# Patient Record
Sex: Male | Born: 1955 | Race: White | Hispanic: No | Smoking: Current every day smoker
Health system: Southern US, Community
[De-identification: ages and names within clinical notes are randomized; demographics above are authoritative.]

## PROBLEM LIST (undated history)

## (undated) DIAGNOSIS — I10 Essential (primary) hypertension: Secondary | ICD-10-CM

## (undated) DIAGNOSIS — F329 Major depressive disorder, single episode, unspecified: Secondary | ICD-10-CM

## (undated) DIAGNOSIS — J449 Chronic obstructive pulmonary disease, unspecified: Secondary | ICD-10-CM

## (undated) DIAGNOSIS — F32A Depression, unspecified: Secondary | ICD-10-CM

## (undated) DIAGNOSIS — K219 Gastro-esophageal reflux disease without esophagitis: Secondary | ICD-10-CM

## (undated) HISTORY — DX: Depression, unspecified: F32.A

## (undated) HISTORY — DX: Gastro-esophageal reflux disease without esophagitis: K21.9

## (undated) HISTORY — PX: TRACHEOSTOMY: SUR1362

## (undated) HISTORY — DX: Essential (primary) hypertension: I10

## (undated) HISTORY — PX: CHEST TUBE INSERTION: SHX231

---
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
## 2019-05-09 ENCOUNTER — Emergency Department: Payer: Medicaid Other

## 2019-05-09 ENCOUNTER — Inpatient Hospital Stay
Admission: EM | Admit: 2019-05-09 | Discharge: 2019-05-11 | DRG: 177 | Disposition: A | Discharge status: Home / Self Care | Payer: Medicaid Other | Attending: Cardiothoracic Surgery | Admitting: Cardiothoracic Surgery

## 2019-05-09 ENCOUNTER — Other Ambulatory Visit: Payer: Self-pay

## 2019-05-09 ENCOUNTER — Encounter: Payer: Self-pay | Admitting: Emergency Medicine

## 2019-05-09 DIAGNOSIS — Z20828 Contact with and (suspected) exposure to other viral communicable diseases: Secondary | ICD-10-CM | POA: Diagnosis present

## 2019-05-09 DIAGNOSIS — J869 Pyothorax without fistula: Principal | ICD-10-CM | POA: Diagnosis present

## 2019-05-09 DIAGNOSIS — E43 Unspecified severe protein-calorie malnutrition: Secondary | ICD-10-CM | POA: Diagnosis present

## 2019-05-09 DIAGNOSIS — Z978 Presence of other specified devices: Secondary | ICD-10-CM | POA: Diagnosis not present

## 2019-05-09 DIAGNOSIS — Z560 Unemployment, unspecified: Secondary | ICD-10-CM

## 2019-05-09 DIAGNOSIS — F418 Other specified anxiety disorders: Secondary | ICD-10-CM | POA: Diagnosis present

## 2019-05-09 DIAGNOSIS — Z681 Body mass index (BMI) 19 or less, adult: Secondary | ICD-10-CM

## 2019-05-09 DIAGNOSIS — Z880 Allergy status to penicillin: Secondary | ICD-10-CM | POA: Diagnosis not present

## 2019-05-09 DIAGNOSIS — F17210 Nicotine dependence, cigarettes, uncomplicated: Secondary | ICD-10-CM | POA: Diagnosis present

## 2019-05-09 DIAGNOSIS — Z79899 Other long term (current) drug therapy: Secondary | ICD-10-CM | POA: Diagnosis not present

## 2019-05-09 DIAGNOSIS — J948 Other specified pleural conditions: Secondary | ICD-10-CM | POA: Diagnosis present

## 2019-05-09 DIAGNOSIS — Z885 Allergy status to narcotic agent status: Secondary | ICD-10-CM | POA: Diagnosis not present

## 2019-05-09 DIAGNOSIS — Z597 Insufficient social insurance and welfare support: Secondary | ICD-10-CM | POA: Diagnosis not present

## 2019-05-09 DIAGNOSIS — Z23 Encounter for immunization: Secondary | ICD-10-CM

## 2019-05-09 DIAGNOSIS — J449 Chronic obstructive pulmonary disease, unspecified: Secondary | ICD-10-CM | POA: Diagnosis present

## 2019-05-09 HISTORY — DX: Chronic obstructive pulmonary disease, unspecified: J44.9

## 2019-05-09 LAB — COMPREHENSIVE METABOLIC PANEL
ALT: 7 U/L (ref 0–44)
AST: 15 U/L (ref 15–41)
Albumin: 2.9 g/dL — ABNORMAL LOW (ref 3.5–5.0)
Alkaline Phosphatase: 111 U/L (ref 38–126)
Anion gap: 12 (ref 5–15)
BUN: 10 mg/dL (ref 8–23)
CO2: 24 mmol/L (ref 22–32)
Calcium: 9.3 mg/dL (ref 8.9–10.3)
Chloride: 100 mmol/L (ref 98–111)
Creatinine, Ser: 0.89 mg/dL (ref 0.61–1.24)
GFR calc Af Amer: >60 mL/min (ref >60)
GFR calc non Af Amer: >60 mL/min (ref >60)
Glucose, Bld: 114 mg/dL — ABNORMAL HIGH (ref 70–99)
Potassium: 4.8 mmol/L (ref 3.5–5.1)
Sodium: 136 mmol/L (ref 135–145)
Total Bilirubin: 0.6 mg/dL (ref 0.3–1.2)
Total Protein: 8.4 g/dL — ABNORMAL HIGH (ref 6.5–8.1)

## 2019-05-09 LAB — PROCALCITONIN: Procalcitonin: 0.11 ng/mL

## 2019-05-09 LAB — URINALYSIS, COMPLETE (UACMP) WITH MICROSCOPIC
Bacteria, UA: NONE SEEN
Bilirubin Urine: NEGATIVE
Glucose, UA: NEGATIVE mg/dL
Hgb urine dipstick: NEGATIVE
Ketones, ur: NEGATIVE mg/dL
Leukocytes,Ua: NEGATIVE
Nitrite: NEGATIVE
Protein, ur: NEGATIVE mg/dL
Specific Gravity, Urine: 1.017 (ref 1.005–1.030)
Squamous Epithelial / HPF: NONE SEEN (ref 0–5)
pH: 5 (ref 5.0–8.0)

## 2019-05-09 LAB — CBC WITH DIFFERENTIAL/PLATELET
Abs Immature Granulocytes: 0.06 10*3/uL (ref 0.00–0.07)
Basophils Absolute: 0.1 10*3/uL (ref 0.0–0.1)
Basophils Relative: 1 %
Eosinophils Absolute: 0.1 10*3/uL (ref 0.0–0.5)
Eosinophils Relative: 1 %
HCT: 31.6 % — ABNORMAL LOW (ref 39.0–52.0)
Hemoglobin: 9.8 g/dL — ABNORMAL LOW (ref 13.0–17.0)
Immature Granulocytes: 1 %
Lymphocytes Relative: 10 %
Lymphs Abs: 1.3 10*3/uL (ref 0.7–4.0)
MCH: 29.3 pg (ref 26.0–34.0)
MCHC: 31 g/dL (ref 30.0–36.0)
MCV: 94.6 fL (ref 80.0–100.0)
Monocytes Absolute: 0.7 10*3/uL (ref 0.1–1.0)
Monocytes Relative: 5 %
Neutro Abs: 10.7 10*3/uL — ABNORMAL HIGH (ref 1.7–7.7)
Neutrophils Relative %: 82 %
Platelets: 110 10*3/uL — ABNORMAL LOW (ref 150–400)
RBC: 3.34 MIL/uL — ABNORMAL LOW (ref 4.22–5.81)
RDW: 13.3 % (ref 11.5–15.5)
WBC: 12.9 10*3/uL — ABNORMAL HIGH (ref 4.0–10.5)
nRBC: 0 % (ref 0.0–0.2)

## 2019-05-09 LAB — SARS CORONAVIRUS 2 BY RT PCR (HOSPITAL ORDER, PERFORMED IN ~~LOC~~ HOSPITAL LAB): SARS Coronavirus 2: NEGATIVE

## 2019-05-09 LAB — LACTIC ACID, PLASMA
Lactic Acid, Venous: 3.6 mmol/L (ref 0.5–1.9)
Lactic Acid, Venous: 3 mmol/L (ref 0.5–1.9)

## 2019-05-09 MED ORDER — VANCOMYCIN HCL IN DEXTROSE 1-5 GM/200ML-% IV SOLN: 1000.0000 mg | Freq: Once | INTRAVENOUS | Status: DC

## 2019-05-09 MED ORDER — BISACODYL 5 MG PO TBEC
10.0000 mg | DELAYED_RELEASE_TABLET | Freq: Every day | ORAL | Status: DC
  Filled 2019-05-09 (×2): qty 2

## 2019-05-09 MED ORDER — TRAMADOL HCL 50 MG PO TABS: 50.0000 mg | ORAL_TABLET | Freq: Four times a day (QID) | ORAL | Status: DC | PRN

## 2019-05-09 MED ORDER — ACETAMINOPHEN 325 MG PO TABS
650.0000 mg | ORAL_TABLET | ORAL | Status: DC | PRN
  Administered 2019-05-09: 20:00:00 650 mg via ORAL
  Filled 2019-05-09: qty 2

## 2019-05-09 MED ORDER — SODIUM CHLORIDE 0.9 % IV SOLN
2.0000 g | Freq: Once | INTRAVENOUS | Status: AC
  Administered 2019-05-09: 2 g via INTRAVENOUS
  Filled 2019-05-09: qty 2

## 2019-05-09 MED ORDER — DEXTROSE-NACL 5-0.45 % IV SOLN
INTRAVENOUS | Status: DC
  Administered 2019-05-10: via INTRAVENOUS

## 2019-05-09 MED ORDER — VANCOMYCIN HCL 1.5 G IV SOLR
1500.0000 mg | Freq: Once | INTRAVENOUS | Status: AC
  Administered 2019-05-09: 20:00:00 1500 mg via INTRAVENOUS
  Filled 2019-05-09: qty 1500

## 2019-05-09 MED ORDER — INFLUENZA VAC SPLIT QUAD 0.5 ML IM SUSY
0.5000 mL | PREFILLED_SYRINGE | INTRAMUSCULAR | Status: AC
  Administered 2019-05-10: 0.5 mL via INTRAMUSCULAR
  Filled 2019-05-09: qty 0.5

## 2019-05-09 MED ORDER — LIDOCAINE HCL (PF) 1 % IJ SOLN
INTRAMUSCULAR | Status: AC
  Filled 2019-05-09: qty 5

## 2019-05-09 MED ORDER — SODIUM CHLORIDE 0.9% FLUSH: 3.0000 mL | Freq: Once | INTRAVENOUS | Status: DC

## 2019-05-09 MED ORDER — ACETAMINOPHEN 325 MG PO TABS: 650.0000 mg | ORAL_TABLET | Freq: Four times a day (QID) | ORAL | Status: DC | PRN

## 2019-05-09 MED ORDER — METRONIDAZOLE IN NACL 5-0.79 MG/ML-% IV SOLN
500.0000 mg | Freq: Once | INTRAVENOUS | Status: AC
  Administered 2019-05-09: 18:00:00 500 mg via INTRAVENOUS
  Filled 2019-05-09: qty 100

## 2019-05-09 MED ORDER — PNEUMOCOCCAL VAC POLYVALENT 25 MCG/0.5ML IJ INJ
0.5000 mL | INJECTION | INTRAMUSCULAR | Status: AC
  Administered 2019-05-10: 13:00:00 0.5 mL via INTRAMUSCULAR
  Filled 2019-05-09: qty 0.5

## 2019-05-09 MED ORDER — ALBUTEROL SULFATE (2.5 MG/3ML) 0.083% IN NEBU
2.5000 mg | INHALATION_SOLUTION | RESPIRATORY_TRACT | Status: DC
  Administered 2019-05-10 (×3): 2.5 mg via RESPIRATORY_TRACT
  Filled 2019-05-09 (×3): qty 3

## 2019-05-09 NOTE — ED Notes (Signed)
Pt watching tv.  No acute resp distress.  Pt eating dinner tray.  Pt waiting on admission.  Family at bedside.  Pt alert.
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
## 2019-05-09 NOTE — ED Notes (Signed)
Report off to daniel rn  

## 2019-05-09 NOTE — ED Notes (Addendum)
Resumed care from [place] rn, pt sitting in a recliner connected to suction.  Dr Faith Rogue and his pa-c arrived in the room.   Family with pt.  Pa-c zac suturing chest tube in left chest in place.  Chest tube was not replaced, only suture and pleurvac replaced.  Pt tolerated well.    Pt alert and sitting in a recliner.

## 2019-05-09 NOTE — ED Provider Notes (Signed)
-----------------------------------------   3:08 PM on 05/09/2019 -----------------------------------------  Blood pressure 115/78, pulse (!) 112, temperature 98 F (36.7 C), temperature source Oral, resp. rate 19, height 5\' 11"  (1.803 m), weight 67.6 kg, SpO2 100 %.  Assuming care from Dr. Joan Mayans.  In short, Tom Mcclure is a 63 y.o. male with a chief complaint of Chest Tube .  Refer to the original H&P for additional details.  The current plan of care is to follow-up CT surgery recommendations for likely infected CT, has received broad spectrum antibiotics.  Patient evaluated by CT surgery, who agree with plan for antibiotics and have secured the chest tube.  They will discussed with hospitalist to assist during admission.    Blake Divine, MD 05/09/19 [phone]
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
## 2019-05-09 NOTE — ED Notes (Signed)
Attempted to call report. Was told nurse was with another patient.

## 2019-05-09 NOTE — Progress Notes (Signed)
PHARMACY -  BRIEF ANTIBIOTIC NOTE   Pharmacy has received consult(s) for Vancomycin and Cefepime from an ED provider.  The patient's profile has been reviewed for ht/wt/allergies/indication/available labs.    One time order(s) placed for Vancomycin 1500mg  IV and Cefepime 2g IV  Further antibiotics/pharmacy consults should be ordered by admitting physician if indicated.                       Thank you, Pearla Dubonnet 05/09/2019  2:45 PM

## 2019-05-09 NOTE — ED Notes (Signed)
Pt alert.  Chest tube connnected to low intermittent suction.  Xray done.  Family with pt.  Pt waiting on admission.
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
## 2019-05-10 ENCOUNTER — Encounter: Payer: Self-pay | Admitting: Radiology

## 2019-05-10 ENCOUNTER — Inpatient Hospital Stay: Payer: Medicaid Other

## 2019-05-10 MED ORDER — ENSURE ENLIVE PO LIQD
237.0000 mL | Freq: Three times a day (TID) | ORAL | Status: DC
  Administered 2019-05-10 – 2019-05-11 (×3): 237 mL via ORAL

## 2019-05-10 MED ORDER — IBUPROFEN 400 MG PO TABS: 400.0000 mg | ORAL_TABLET | Freq: Four times a day (QID) | ORAL | Status: DC | PRN

## 2019-05-10 MED ORDER — METRONIDAZOLE IN NACL 5-0.79 MG/ML-% IV SOLN
500.0000 mg | Freq: Three times a day (TID) | INTRAVENOUS | Status: DC
  Administered 2019-05-10 – 2019-05-11 (×5): 500 mg via INTRAVENOUS
  Filled 2019-05-10 (×7): qty 100

## 2019-05-10 MED ORDER — VANCOMYCIN HCL IN DEXTROSE 750-5 MG/150ML-% IV SOLN
750.0000 mg | Freq: Two times a day (BID) | INTRAVENOUS | Status: DC
  Administered 2019-05-10 (×2): 750 mg via INTRAVENOUS
  Filled 2019-05-10 (×4): qty 150

## 2019-05-10 MED ORDER — SODIUM CHLORIDE 0.9 % IV SOLN
INTRAVENOUS | Status: DC | PRN
  Administered 2019-05-10 – 2019-05-11 (×3): 250 mL via INTRAVENOUS

## 2019-05-10 MED ORDER — ALBUTEROL SULFATE (2.5 MG/3ML) 0.083% IN NEBU: 2.5000 mg | INHALATION_SOLUTION | RESPIRATORY_TRACT | Status: DC | PRN

## 2019-05-10 MED ORDER — MIRTAZAPINE 15 MG PO TABS
7.5000 mg | ORAL_TABLET | Freq: Every day | ORAL | Status: DC
  Administered 2019-05-10: 7.5 mg via ORAL
  Filled 2019-05-10: qty 1

## 2019-05-10 MED ORDER — QUETIAPINE FUMARATE 25 MG PO TABS
50.0000 mg | ORAL_TABLET | Freq: Every day | ORAL | Status: DC
  Administered 2019-05-10: 50 mg via ORAL
  Filled 2019-05-10: qty 2

## 2019-05-10 MED ORDER — ALBUTEROL SULFATE (2.5 MG/3ML) 0.083% IN NEBU
2.5000 mg | INHALATION_SOLUTION | Freq: Four times a day (QID) | RESPIRATORY_TRACT | Status: DC
  Filled 2019-05-10 (×2): qty 3

## 2019-05-10 MED ORDER — IBUPROFEN 400 MG PO TABS
400.0000 mg | ORAL_TABLET | Freq: Four times a day (QID) | ORAL | Status: DC | PRN
  Administered 2019-05-10 (×2): 400 mg via ORAL
  Filled 2019-05-10 (×2): qty 1

## 2019-05-10 MED ORDER — SODIUM CHLORIDE 0.9 % IV SOLN
2.0000 g | Freq: Two times a day (BID) | INTRAVENOUS | Status: DC
  Administered 2019-05-10 – 2019-05-11 (×3): 2 g via INTRAVENOUS
  Filled 2019-05-10 (×4): qty 2

## 2019-05-10 MED ORDER — VITAMIN C 500 MG PO TABS
250.0000 mg | ORAL_TABLET | Freq: Two times a day (BID) | ORAL | Status: DC
  Administered 2019-05-10 – 2019-05-11 (×2): 250 mg via ORAL
  Filled 2019-05-10 (×2): qty 1

## 2019-05-10 MED ORDER — POLYETHYLENE GLYCOL 3350 17 G PO PACK
17.0000 g | PACK | Freq: Every day | ORAL | Status: DC
  Filled 2019-05-10: qty 1

## 2019-05-10 MED ORDER — IOHEXOL 300 MG/ML  SOLN
60.0000 mL | Freq: Once | INTRAMUSCULAR | Status: AC | PRN
  Administered 2019-05-10: 60 mL via INTRAVENOUS

## 2019-05-10 MED ORDER — HYDROMORPHONE HCL 1 MG/ML IJ SOLN: 1.0000 mg | Freq: Four times a day (QID) | INTRAMUSCULAR | Status: DC | PRN

## 2019-05-10 MED ORDER — SERTRALINE HCL 50 MG PO TABS
50.0000 mg | ORAL_TABLET | Freq: Every day | ORAL | Status: DC
  Administered 2019-05-10 – 2019-05-11 (×2): 50 mg via ORAL
  Filled 2019-05-10 (×2): qty 1

## 2019-05-10 MED ORDER — ADULT MULTIVITAMIN W/MINERALS CH
1.0000 | ORAL_TABLET | Freq: Every day | ORAL | Status: DC
  Administered 2019-05-11: 10:00:00 1 via ORAL
  Filled 2019-05-10: qty 1

## 2019-05-10 MED ORDER — FOLIC ACID 1 MG PO TABS
1.0000 mg | ORAL_TABLET | Freq: Every day | ORAL | Status: DC
  Administered 2019-05-10 – 2019-05-11 (×2): 1 mg via ORAL
  Filled 2019-05-10 (×2): qty 1

## 2019-05-10 MED ORDER — MOMETASONE FURO-FORMOTEROL FUM 200-5 MCG/ACT IN AERO
2.0000 | INHALATION_SPRAY | Freq: Two times a day (BID) | RESPIRATORY_TRACT | Status: DC
  Administered 2019-05-10 – 2019-05-11 (×3): 2 via RESPIRATORY_TRACT
  Filled 2019-05-10: qty 8.8

## 2019-05-10 MED ORDER — ENOXAPARIN SODIUM 30 MG/0.3ML ~~LOC~~ SOLN
30.0000 mg | SUBCUTANEOUS | Status: DC
  Filled 2019-05-10: qty 0.3

## 2019-05-10 MED ORDER — HYDROCODONE-ACETAMINOPHEN 5-325 MG PO TABS
1.0000 | ORAL_TABLET | ORAL | Status: DC | PRN
  Administered 2019-05-10 – 2019-05-11 (×2): 1 via ORAL
  Filled 2019-05-10 (×2): qty 1
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
## 2019-05-10 NOTE — Plan of Care (Signed)
  Problem: Activity: Goal: Risk for activity intolerance will decrease Outcome: Progressing   Problem: Nutrition: Goal: Adequate nutrition will be maintained Outcome: Progressing   Problem: Coping: Goal: Level of anxiety will decrease Outcome: Progressing   Problem: Pain Managment: Goal: General experience of comfort will improve Outcome: Progressing   Problem: Skin Integrity: Goal: Risk for impaired skin integrity will decrease Outcome: Progressing   

## 2019-05-10 NOTE — Progress Notes (Signed)
Spoke with Dr. Genevive Bi about pt. Pt complaining of 10 out of 10 pain at chest tube site, he gave orders for 400mg  advil q6hr PRN. chest tube also has significant are leak... Dr. Genevive Bi aware, is to be on -20cm H20. Gave orders to advance diet to regular diet. Dr. Genevive Bi will assess pt tomorrow. Will continue to monitor.  Conley Simmonds, RN, BSN

## 2019-05-10 NOTE — Progress Notes (Signed)
  Patient ID: Tom Mcclure, male   DOB: 1956/01/29, 63 y.o.   MRN: [id number]  HISTORY: Quiet night.  Had pain around chest tube site.  Baseline shortness of breath is unchanged.  Hungry this morning and wants to eat.  No fever overnight.   Vitals:   05/09/19 2353 05/10/19 0455  BP: 107/70 118/71  Pulse: 86 81  Resp: 18 18  Temp: 97.9 F (36.6 C) 98.3 F (36.8 C)  SpO2: 100% 99%     EXAM:    Resp: Lungs are very distant with mechanical sounds from chest tube suction.  No respiratory distress, normal effort. Heart:  Regular without murmurs Abd:  Abdomen is soft, non distended and non tender. No masses are palpable.  There is no rebound and no guarding.  Neurological: Alert and oriented to person, place, and time. Coordination normal.  Skin: Skin is warm and dry. No rash noted. No diaphoretic. No erythema. No pallor.  Psychiatric: Normal mood and affect. Normal behavior. Judgment and thought content normal.   There is a large air leak present.  Chest tube drainage is purulent  ASSESSMENT: Empyema left chest   PLAN:   Will leave chest tube on suction for now. Will obtain chest CT today  Will ask PT to see  I have tried to contact hospitalist this morning for help in managing his medical problems Will try to get all the information from [facility] [employer] regarding his admission there     Nestor Lewandowsky, MDPatient ID: Tom Mcclure, male   DOB: 1955-12-20, 63 y.o.   MRN: [id number]
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
## 2019-05-10 NOTE — Progress Notes (Signed)
Spoke with Mr. Necaise briefly he was watching TV in the bed, calm, alert and oriented X 4. Assessed his chest tube and drainage system. Chest tube is a yellow, rubber tube connected to an [facility] chest drainage system.Drainage system intact. Continuous bubbling in air leak detection chamber likely from healing lung. Purulent drainage in drainage system. Dr. Genevive Bi changed drainage system at 12:30 and requested water seal/off suction. After this patient had intermittent bubbling in water seal detection chamber. Dressing over chest tube clean dry and intact.
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
## 2019-05-10 NOTE — Plan of Care (Signed)
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
## 2019-05-11 ENCOUNTER — Inpatient Hospital Stay: Payer: Medicaid Other

## 2019-05-11 DIAGNOSIS — E43 Unspecified severe protein-calorie malnutrition: Secondary | ICD-10-CM | POA: Insufficient documentation

## 2019-05-11 LAB — LACTIC ACID, PLASMA: Lactic Acid, Venous: 1.5 mmol/L (ref 0.5–1.9)

## 2019-05-11 LAB — CREATININE, SERUM
Creatinine, Ser: 0.66 mg/dL (ref 0.61–1.24)
GFR calc Af Amer: >60 mL/min (ref >60)
GFR calc non Af Amer: >60 mL/min (ref >60)

## 2019-05-11 MED ORDER — VANCOMYCIN HCL 500 MG IV SOLR
500.0000 mg | Freq: Two times a day (BID) | INTRAVENOUS | Status: DC
  Administered 2019-05-11: 500 mg via INTRAVENOUS
  Filled 2019-05-11 (×2): qty 500

## 2019-05-11 MED ORDER — MIRTAZAPINE 15 MG PO TABS: 7.5000 mg | ORAL_TABLET | Freq: Every day | ORAL | 2 refills | Status: AC

## 2019-05-11 MED ORDER — METOPROLOL TARTRATE 25 MG PO TABS: 25.0000 mg | ORAL_TABLET | Freq: Two times a day (BID) | ORAL | 2 refills | Status: DC

## 2019-05-11 MED ORDER — BUDESONIDE-FORMOTEROL FUMARATE 160-4.5 MCG/ACT IN AERO: 2.0000 | INHALATION_SPRAY | Freq: Two times a day (BID) | RESPIRATORY_TRACT | 12 refills | Status: DC

## 2019-05-11 MED ORDER — AMOXICILLIN-POT CLAVULANATE 875-125 MG PO TABS: 1.0000 | ORAL_TABLET | Freq: Two times a day (BID) | ORAL | 0 refills | Status: DC

## 2019-05-11 MED ORDER — ALBUTEROL SULFATE (2.5 MG/3ML) 0.083% IN NEBU: 2.5000 mg | INHALATION_SOLUTION | Freq: Four times a day (QID) | RESPIRATORY_TRACT | Status: DC | PRN

## 2019-05-11 MED ORDER — HYDROCODONE-ACETAMINOPHEN 5-325 MG PO TABS: 1.0000 | ORAL_TABLET | ORAL | 0 refills | Status: DC | PRN

## 2019-05-11 MED ORDER — LEVOFLOXACIN 500 MG PO TABS: 500.0000 mg | ORAL_TABLET | Freq: Every day | ORAL | 0 refills | Status: AC

## 2019-05-11 MED ORDER — QUETIAPINE FUMARATE 50 MG PO TABS: 50.0000 mg | ORAL_TABLET | Freq: Every day | ORAL | 2 refills | Status: AC

## 2019-05-11 MED ORDER — SERTRALINE HCL 50 MG PO TABS: 50.0000 mg | ORAL_TABLET | Freq: Every day | ORAL | 2 refills | Status: AC

## 2019-05-11 NOTE — Plan of Care (Signed)
  Problem: Clinical Measurements: Goal: Respiratory complications will improve Outcome: Progressing   Problem: Activity: Goal: Risk for activity intolerance will decrease Outcome: Progressing   Problem: Nutrition: Goal: Adequate nutrition will be maintained Outcome: Progressing   Problem: Coping: Goal: Level of anxiety will decrease Outcome: Progressing   Problem: Pain Managment: Goal: General experience of comfort will improve Outcome: Progressing   Problem: Safety: Goal: Ability to remain free from injury will improve Outcome: Progressing   

## 2019-05-11 NOTE — Discharge Summary (Signed)
Physician Discharge Summary  Patient ID: Tom Mcclure MRN: [id number] DOB/AGE: 05/03/1956 63 y.o.  Admit date: 05/09/2019 Discharge date: 05/11/2019   Discharge Diagnoses:  Active Problems:   Empyema (HCC)   Protein-calorie malnutrition, severe   Procedures: None  Hospital Course: This patient was admitted to the hospital on 05/09/2019 after being admitted through the emergency room.  He was brought to the emergency room by his daughter who states that he was discharged from a facility in [place] with a chest tube in place and told to establish care here in [place].  At that time it was unclear what exactly he had performed but once records were obtained from [place] over the following few days it was clear that he had a pneumonia with an empyema and is status post thoracoscopic decortication.  When he was discharged from [place] a large bore [place] gastrostomy type tube was inserted.  This was draining to a bag which was completely full.  He was brought into the emergency room by his daughter who is going to be his primary caregiver.  While in the hospital he had a CT scan and multiple chest x-rays all of which demonstrated a loculated empyema cavity which was adequately drained by a large bore chest tube.  He was seen by our social workers our hospitalists physical therapist and nutritionist.  Appropriate follow-ups were made and their input was invaluable during his care.  He was discharged to home with his chest tube draining into a Foley bag.  His chest x-rays were stable.  He will follow-up in my office in 5 days.  And discharge instructions were provided.  Our Education officer, museum met with the family to arrange for medications and supplies.  Disposition: Discharge disposition: 01-Home or Self Care       Discharge Instructions    Activity as tolerated - No restrictions   Complete by: As directed    Diet - low sodium heart healthy   Complete by: As directed    Diet general   Complete  by: As directed    Increase activity slowly   Complete by: As directed      Allergies as of 05/11/2019      Reactions   Morphine And Related Hives   Penicillins Hives      Medication List    TAKE these medications   budesonide-formoterol 160-4.5 MCG/ACT inhaler Commonly known as: Symbicort Inhale 2 puffs into the lungs 2 (two) times daily.   famotidine 20 MG tablet Commonly known as: PEPCID Take 20 mg by mouth daily.   folic acid 1 MG tablet Commonly known as: FOLVITE Take 1 mg by mouth daily.   HYDROcodone-acetaminophen 5-325 MG tablet Commonly known as: NORCO/VICODIN Take 1 tablet by mouth every 4 (four) hours as needed for moderate pain.   ipratropium 17 MCG/ACT inhaler Commonly known as: ATROVENT HFA Inhale 1 puff into the lungs 4 (four) times daily.   levalbuterol 45 MCG/ACT inhaler Commonly known as: XOPENEX HFA Inhale 1-2 puffs into the lungs every 4 (four) hours as needed for wheezing.   levofloxacin 500 MG tablet Commonly known as: Levaquin Take 1 tablet (500 mg total) by mouth daily for 10 days.   metoprolol tartrate 25 MG tablet Commonly known as: LOPRESSOR Take 1 tablet (25 mg total) by mouth 2 (two) times daily. What changed:   medication strength  how much to take   mirtazapine 15 MG tablet Commonly known as: REMERON Take 0.5 tablets (7.5 mg total) by mouth  at bedtime.   polyethylene glycol 17 g packet Commonly known as: MIRALAX / GLYCOLAX Take 17 g by mouth daily.   QUEtiapine 50 MG tablet Commonly known as: SEROQUEL Take 1 tablet (50 mg total) by mouth at bedtime.   senna 8.6 MG tablet Commonly known as: SENOKOT Take 1 tablet by mouth daily.   sertraline 50 MG tablet Commonly known as: ZOLOFT Take 1 tablet (50 mg total) by mouth daily.   thiamine 100 MG tablet Take 100 mg by mouth daily.   vitamin C 500 MG tablet Commonly known as: ASCORBIC ACID Take 500 mg by mouth daily.      Follow-up Information    Adi Seales  Follow up in 1 week(s).           Nestor Lewandowsky, MD

## 2019-05-11 NOTE — Clinical Social Work Note (Signed)
CSW received consult that patient does not have insurance, and needs assistance with medications.  CSW spoke to him and provided good rx coupons for his medications.  Patient was also able to get his inhaler from [facility].  CSW tried to arrange charity home health, spoke to [facility], and they can not accept patient because of his chest tube.  [facility] spoke to [facility] and they are not able to take a chest tube either.  [facility] is not able to accept patient due to his chest tube, patient's daughter was educated on wound care by physician and bedside nurse.  Per patients daughter they have applied for insurance through the affordable care act, and they tried applying for Medicare, but patient was not eligible.  CSW informed her to try to contact DSS to see if he is eligible for Medicaid and to call social security to apply for disability.  Jones Broom. Norval Morton, MSW, LCSW [phone]  05/11/2019 5:57 PM

## 2019-05-11 NOTE — Plan of Care (Signed)
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
## 2019-05-11 NOTE — Progress Notes (Signed)
Patient given discharge instructions with prescriptions with daughter at bedside. Dr. Genevive Bi also at bedside changing chest tube dressing for daughter to watch. MD also went over how to care for the chest tube. Randall Hiss, Education officer, museum spoke with patient and daughter regarding medications. Meds will be ready to be picked up at [facility] after 2pm. Patient and daughter verbalized understanding. Home health set up.
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
## 2019-05-12 ENCOUNTER — Other Ambulatory Visit: Payer: Self-pay

## 2019-05-12 DIAGNOSIS — J869 Pyothorax without fistula: Secondary | ICD-10-CM

## 2019-05-13 ENCOUNTER — Encounter: Payer: Self-pay | Admitting: Cardiothoracic Surgery

## 2019-05-14 LAB — CULTURE, BLOOD (ROUTINE X 2)
Culture: NO GROWTH
Culture: NO GROWTH
Special Requests: ADEQUATE
Special Requests: ADEQUATE

## 2019-05-14 LAB — AEROBIC/ANAEROBIC CULTURE W GRAM STAIN (SURGICAL/DEEP WOUND)

## 2019-05-16 ENCOUNTER — Encounter: Payer: Self-pay | Admitting: Physician Assistant

## 2019-05-16 ENCOUNTER — Ambulatory Visit (INDEPENDENT_AMBULATORY_CARE_PROVIDER_SITE_OTHER): Payer: Self-pay | Admitting: Physician Assistant

## 2019-05-16 ENCOUNTER — Other Ambulatory Visit: Payer: Self-pay

## 2019-05-16 DIAGNOSIS — J869 Pyothorax without fistula: Secondary | ICD-10-CM
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
## 2019-05-16 NOTE — Patient Instructions (Addendum)
Please be sure to have your Chest xray prior to seeing Dr.Oaks 05/19/2019. Continue with the Boost / Ensure daily. Please continue to clean the area and apply dressing daily.

## 2019-05-19 ENCOUNTER — Encounter: Payer: Self-pay | Admitting: Cardiothoracic Surgery

## 2019-05-19 ENCOUNTER — Ambulatory Visit
Admission: RE | Admit: 2019-05-19 | Discharge: 2019-05-19 | Disposition: A | Discharge status: Home / Self Care | Payer: Medicaid Other | Source: Ambulatory Visit | Attending: Cardiothoracic Surgery | Admitting: Cardiothoracic Surgery

## 2019-05-19 ENCOUNTER — Other Ambulatory Visit: Payer: Self-pay

## 2019-05-19 ENCOUNTER — Ambulatory Visit (INDEPENDENT_AMBULATORY_CARE_PROVIDER_SITE_OTHER): Payer: Self-pay | Admitting: Cardiothoracic Surgery

## 2019-05-19 VITALS — BP 112/81 | HR 114 | Temp 98.2°F | Resp 18 | Ht 71.0 in | Wt 101.2 lb

## 2019-05-19 DIAGNOSIS — J869 Pyothorax without fistula: Secondary | ICD-10-CM

## 2019-05-19 NOTE — Progress Notes (Signed)
  Patient ID: Tom Mcclure, male   DOB: Aug 23, 1955, 63 y.o.   MRN: [id number]  HISTORY: He returns today in follow-up.  He is accompanied today by his daughter.  She states that he is able to sit up for about 3 hours throughout the day but then has to go back to bed.  She states that this is because of his shortness of breath.  He also states that he would like something stronger than the current narcotics that he is taking.  He has 20 more days left on his Levaquin.  The drainage from the catheter has been much more clear.  He continues to have an air leak from the system.   Vitals:   05/19/19 0830  BP: 112/81  Pulse: (!) 114  Resp: 18  Temp: 98.2 F (36.8 C)  SpO2: 100%     EXAM:    Resp: Lungs are clear bilaterally but are extremely distant. no respiratory distress, normal effort. Heart:  Regular without murmurs Abd:  Abdomen is soft, non distended and non tender. No masses are palpable.  There is no rebound and no guarding.  Neurological: Alert and oriented to person, place, and time. Coordination normal.  Skin: Skin is warm and dry. No rash noted. No diaphoretic. No erythema. No pallor.  Psychiatric: Normal mood and affect. Normal behavior. Judgment and thought content normal.   Independent review of his chest x-ray shows a continued loculated hydropneumothorax at the base which is unchanged   ASSESSMENT: Empyema cavity   PLAN:   He seems to be getting along pretty well.  I would like him to see our pulmonologist for their input.  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  However right now he seems to be getting along pretty well with the current mushroom catheter in place.  I did secure this again today with a 0 Prolene to prevent it from falling out.  I would also like him to see our pain management service for his chronic pain issues.  I will see him back again in 2 weeks.  I again reviewed with him and his  daughter the procedure of an Eloesser flap.  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  I have also asked him to establish a primary care physician.    Nestor Lewandowsky, MD

## 2019-05-19 NOTE — Patient Instructions (Signed)
We will send the referral to the pain clinic and to pulmonary. Someone from their clinic will call to schedule an appointment.  Please have your chest xray prior to seeing Dr.Oaks.  Please see your follow up appointment listed below.

## 2019-05-25 ENCOUNTER — Ambulatory Visit: Payer: Self-pay | Admitting: Pharmacy Technician

## 2019-05-25 DIAGNOSIS — Z79899 Other long term (current) drug therapy: Secondary | ICD-10-CM

## 2019-06-03 ENCOUNTER — Ambulatory Visit: Payer: Self-pay | Admitting: Cardiothoracic Surgery

## 2019-06-03 ENCOUNTER — Telehealth: Payer: Self-pay

## 2019-06-03 NOTE — Telephone Encounter (Signed)
Spoke with Maudie Mercury regarding referral to Pulmonary. Maudie Mercury stated she would reach out to schedule an appointment for the patient to see Dr.kasa.

## 2019-06-10 ENCOUNTER — Ambulatory Visit
Admission: RE | Admit: 2019-06-10 | Discharge: 2019-06-10 | Disposition: A | Discharge status: Home / Self Care | Payer: Medicaid Other | Source: Home / Self Care | Attending: Cardiothoracic Surgery | Admitting: Cardiothoracic Surgery

## 2019-06-10 ENCOUNTER — Inpatient Hospital Stay
Admission: AD | Admit: 2019-06-10 | Discharge: 2019-06-19 | DRG: 166 | Disposition: A | Discharge status: Home Health | Payer: Medicaid Other | Source: Ambulatory Visit | Attending: Cardiothoracic Surgery | Admitting: Cardiothoracic Surgery

## 2019-06-10 ENCOUNTER — Ambulatory Visit (INDEPENDENT_AMBULATORY_CARE_PROVIDER_SITE_OTHER): Payer: Self-pay | Admitting: Cardiothoracic Surgery

## 2019-06-10 ENCOUNTER — Other Ambulatory Visit: Payer: Self-pay

## 2019-06-10 ENCOUNTER — Ambulatory Visit
Admission: RE | Admit: 2019-06-10 | Discharge: 2019-06-10 | Disposition: A | Discharge status: Home / Self Care | Payer: Medicaid Other | Source: Ambulatory Visit | Attending: Cardiothoracic Surgery | Admitting: Cardiothoracic Surgery

## 2019-06-10 ENCOUNTER — Encounter: Payer: Self-pay | Admitting: Cardiothoracic Surgery

## 2019-06-10 VITALS — BP 141/84 | HR 61 | Temp 97.7°F | Ht 71.0 in | Wt 100.0 lb

## 2019-06-10 DIAGNOSIS — F1010 Alcohol abuse, uncomplicated: Secondary | ICD-10-CM | POA: Diagnosis present

## 2019-06-10 DIAGNOSIS — F17210 Nicotine dependence, cigarettes, uncomplicated: Secondary | ICD-10-CM | POA: Diagnosis present

## 2019-06-10 DIAGNOSIS — J869 Pyothorax without fistula: Secondary | ICD-10-CM

## 2019-06-10 DIAGNOSIS — K219 Gastro-esophageal reflux disease without esophagitis: Secondary | ICD-10-CM | POA: Diagnosis present

## 2019-06-10 DIAGNOSIS — I10 Essential (primary) hypertension: Secondary | ICD-10-CM | POA: Diagnosis present

## 2019-06-10 DIAGNOSIS — R64 Cachexia: Secondary | ICD-10-CM | POA: Diagnosis present

## 2019-06-10 DIAGNOSIS — Z590 Homelessness: Secondary | ICD-10-CM

## 2019-06-10 DIAGNOSIS — S2193XS Puncture wound without foreign body of unspecified part of thorax, sequela: Secondary | ICD-10-CM

## 2019-06-10 DIAGNOSIS — B9562 Methicillin resistant Staphylococcus aureus infection as the cause of diseases classified elsewhere: Secondary | ICD-10-CM | POA: Diagnosis present

## 2019-06-10 DIAGNOSIS — Z8614 Personal history of Methicillin resistant Staphylococcus aureus infection: Secondary | ICD-10-CM

## 2019-06-10 DIAGNOSIS — Z885 Allergy status to narcotic agent status: Secondary | ICD-10-CM

## 2019-06-10 DIAGNOSIS — Z681 Body mass index (BMI) 19 or less, adult: Secondary | ICD-10-CM

## 2019-06-10 DIAGNOSIS — Z79899 Other long term (current) drug therapy: Secondary | ICD-10-CM

## 2019-06-10 DIAGNOSIS — S27391S Other injuries of lung, unilateral, sequela: Secondary | ICD-10-CM

## 2019-06-10 DIAGNOSIS — N400 Enlarged prostate without lower urinary tract symptoms: Secondary | ICD-10-CM | POA: Diagnosis present

## 2019-06-10 DIAGNOSIS — J9621 Acute and chronic respiratory failure with hypoxia: Secondary | ICD-10-CM | POA: Diagnosis not present

## 2019-06-10 DIAGNOSIS — J9811 Atelectasis: Secondary | ICD-10-CM | POA: Diagnosis present

## 2019-06-10 DIAGNOSIS — F329 Major depressive disorder, single episode, unspecified: Secondary | ICD-10-CM | POA: Diagnosis present

## 2019-06-10 DIAGNOSIS — J948 Other specified pleural conditions: Secondary | ICD-10-CM | POA: Diagnosis present

## 2019-06-10 DIAGNOSIS — Z9181 History of falling: Secondary | ICD-10-CM

## 2019-06-10 DIAGNOSIS — Z09 Encounter for follow-up examination after completed treatment for conditions other than malignant neoplasm: Secondary | ICD-10-CM

## 2019-06-10 DIAGNOSIS — Z806 Family history of leukemia: Secondary | ICD-10-CM

## 2019-06-10 DIAGNOSIS — Z1623 Resistance to quinolones and fluoroquinolones: Secondary | ICD-10-CM | POA: Diagnosis present

## 2019-06-10 DIAGNOSIS — Z880 Allergy status to penicillin: Secondary | ICD-10-CM

## 2019-06-10 DIAGNOSIS — Z20828 Contact with and (suspected) exposure to other viral communicable diseases: Secondary | ICD-10-CM | POA: Diagnosis present

## 2019-06-10 DIAGNOSIS — B965 Pseudomonas (aeruginosa) (mallei) (pseudomallei) as the cause of diseases classified elsewhere: Secondary | ICD-10-CM | POA: Diagnosis present

## 2019-06-10 DIAGNOSIS — D638 Anemia in other chronic diseases classified elsewhere: Secondary | ICD-10-CM | POA: Diagnosis present

## 2019-06-10 DIAGNOSIS — E871 Hypo-osmolality and hyponatremia: Secondary | ICD-10-CM | POA: Diagnosis present

## 2019-06-10 DIAGNOSIS — E43 Unspecified severe protein-calorie malnutrition: Secondary | ICD-10-CM | POA: Diagnosis present

## 2019-06-10 DIAGNOSIS — J432 Centrilobular emphysema: Secondary | ICD-10-CM | POA: Diagnosis present

## 2019-06-10 LAB — COMPREHENSIVE METABOLIC PANEL
ALT: 15 U/L (ref 0–44)
AST: 18 U/L (ref 15–41)
Albumin: 3 g/dL — ABNORMAL LOW (ref 3.5–5.0)
Alkaline Phosphatase: 87 U/L (ref 38–126)
Anion gap: 17 — ABNORMAL HIGH (ref 5–15)
BUN: 21 mg/dL (ref 8–23)
CO2: 24 mmol/L (ref 22–32)
Calcium: 10.2 mg/dL (ref 8.9–10.3)
Chloride: 93 mmol/L — ABNORMAL LOW (ref 98–111)
Creatinine, Ser: 0.83 mg/dL (ref 0.61–1.24)
GFR calc Af Amer: >60 mL/min (ref >60)
GFR calc non Af Amer: >60 mL/min (ref >60)
Glucose, Bld: 99 mg/dL (ref 70–99)
Potassium: 4.2 mmol/L (ref 3.5–5.1)
Sodium: 134 mmol/L — ABNORMAL LOW (ref 135–145)
Total Bilirubin: 0.4 mg/dL (ref 0.3–1.2)
Total Protein: 8.6 g/dL — ABNORMAL HIGH (ref 6.5–8.1)

## 2019-06-10 LAB — CBC
HCT: 31.3 % — ABNORMAL LOW (ref 39.0–52.0)
Hemoglobin: 10.1 g/dL — ABNORMAL LOW (ref 13.0–17.0)
MCH: 28.6 pg (ref 26.0–34.0)
MCHC: 32.3 g/dL (ref 30.0–36.0)
MCV: 88.7 fL (ref 80.0–100.0)
Platelets: 294 10*3/uL (ref 150–400)
RBC: 3.53 MIL/uL — ABNORMAL LOW (ref 4.22–5.81)
RDW: 13.5 % (ref 11.5–15.5)
WBC: 16.2 10*3/uL — ABNORMAL HIGH (ref 4.0–10.5)
nRBC: 0 % (ref 0.0–0.2)

## 2019-06-10 LAB — PROTIME-INR
INR: 1.1 (ref 0.8–1.2)
Prothrombin Time: 13.8 seconds (ref 11.4–15.2)

## 2019-06-10 LAB — PROCALCITONIN: Procalcitonin: 0.14 ng/mL

## 2019-06-10 LAB — APTT: aPTT: 29 seconds (ref 24–36)

## 2019-06-10 MED ORDER — QUETIAPINE FUMARATE 25 MG PO TABS
50.0000 mg | ORAL_TABLET | Freq: Every day | ORAL | Status: DC
  Administered 2019-06-10 – 2019-06-13 (×4): 50 mg via ORAL
  Filled 2019-06-10 (×4): qty 2

## 2019-06-10 MED ORDER — ENSURE ENLIVE PO LIQD
237.0000 mL | Freq: Three times a day (TID) | ORAL | Status: DC
  Administered 2019-06-10 – 2019-06-13 (×11): 237 mL via ORAL

## 2019-06-10 MED ORDER — SERTRALINE HCL 50 MG PO TABS
50.0000 mg | ORAL_TABLET | Freq: Every day | ORAL | Status: DC
  Administered 2019-06-10 – 2019-06-14 (×5): 50 mg via ORAL
  Filled 2019-06-10 (×5): qty 1

## 2019-06-10 MED ORDER — METOPROLOL TARTRATE 25 MG PO TABS
12.5000 mg | ORAL_TABLET | Freq: Two times a day (BID) | ORAL | Status: DC
  Administered 2019-06-10 – 2019-06-14 (×9): 12.5 mg via ORAL
  Filled 2019-06-10 (×9): qty 1

## 2019-06-10 MED ORDER — MIRTAZAPINE 15 MG PO TABS
7.5000 mg | ORAL_TABLET | Freq: Every day | ORAL | Status: DC
  Administered 2019-06-10 – 2019-06-13 (×4): 7.5 mg via ORAL
  Filled 2019-06-10 (×4): qty 1

## 2019-06-10 MED ORDER — ADULT MULTIVITAMIN W/MINERALS CH
1.0000 | ORAL_TABLET | Freq: Every day | ORAL | Status: DC
  Administered 2019-06-11 – 2019-06-14 (×4): 1 via ORAL
  Filled 2019-06-10 (×4): qty 1

## 2019-06-10 NOTE — Progress Notes (Signed)
Initial Nutrition Assessment  DOCUMENTATION CODES:   Severe malnutrition in context of chronic illness  INTERVENTION:   Ensure Enlive po TID, each supplement provides 350 kcal and 20 grams of protein  Magic cup TID with meals, each supplement provides 290 kcal and 9 grams of protein  MVI daily   Pt likely moderate refeed risk; recommend monitor K, Mg and P labs   NUTRITION DIAGNOSIS:   Severe Malnutrition related to chronic illness(COPD) as evidenced by severe muscle depletion, severe fat depletion.  GOAL:   Patient will meet greater than or equal to 90% of their needs  MONITOR:   PO intake, Supplement acceptance, Labs, Weight trends, Skin, I & O's  REASON FOR ASSESSMENT:   Consult Poor PO  ASSESSMENT:   63 y/o male with h/o COPD admitted with left-sided empyema.   Met with patient in room today. Pt reports poor appetite and oral intake for several months pta; pt reports that he has only been eating sips and bites of meals at home. Pt reports that he just has no appetite. Pt ate ice cream for lunch today; pt reports that he took one bite of his spaghetti and then did not want it anymore. Pt has been drinking 3 vanilla Ensure per day at home. Pt reports a 65lb(40%) weight loss since July; there is no documented weight history to confirm weight loss. RD suspects patient with chronic malnutrition secondary to COPD. RD will add supplements and MVI to help pt meet his estimated needs.   Medications reviewed and include: remeron  Labs reviewed: Na 134(L) Wbc- 16.2(H), Hgb 10.1(L), Hct 31.3(L)  NUTRITION - FOCUSED PHYSICAL EXAM:    Most Recent Value  Orbital Region  Moderate depletion  Upper Arm Region  Severe depletion  Thoracic and Lumbar Region  Severe depletion  Buccal Region  Moderate depletion  Temple Region  Severe depletion  Clavicle Bone Region  Severe depletion  Clavicle and Acromion Bone Region  Severe depletion  Scapular Bone Region  Severe depletion   Dorsal Hand  Severe depletion  Patellar Region  Severe depletion  Anterior Thigh Region  Severe depletion  Posterior Calf Region  Severe depletion  Edema (RD Assessment)  None  Hair  Reviewed  Eyes  Reviewed  Mouth  Reviewed  Skin  Reviewed  Nails  Reviewed     Diet Order:   Diet Order            Diet regular Room service appropriate? Yes; Fluid consistency: Thin  Diet effective now             EDUCATION NEEDS:   Education needs have been addressed  Skin:  Skin Assessment: Reviewed RN Assessment(incision abdomen)  Last BM:  PTA  Height:   Ht Readings from Last 1 Encounters:  06/10/19 _0  (1.803 m)    Weight:   Wt Readings from Last 1 Encounters:  06/10/19 45.4 kg    Ideal Body Weight:  78.1 kg  BMI:  There is no height or weight on file to calculate BMI.  Estimated Nutritional Needs:   Kcal:  1700-1900kcal/day  Protein:  85-95g/day  Fluid:  >1.4L/day  Koleen Distance MS, RD, LDN Pager #- [phone] Office#- [phone] After Hours Pager: [phone]

## 2019-06-10 NOTE — Patient Instructions (Addendum)
We will admit you to the hospital today to get you worked up. They will call you when a bed is available. Please call us if you do not hear from them by 1pm. You will report to the [place] entrance.
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
## 2019-06-10 NOTE — Progress Notes (Signed)
Patient ID: Tom Mcclure, male   DOB: 1956/06/03, 63 y.o.   MRN: [id number]  Chief Complaint  Patient presents with  . Follow-up    Empyema    HPI Tom Mcclure is a 63 y.o. male.  He presented to our facility approximately 1 month ago.  He was discharged from the [facility] after undergoing a thoracoscopy for a left-sided empyema.  When he was discharged from that facility he was told to follow-up with his primary care doctor but because he is uninsured he just came to the emergency room instead.  I saw the patient at that time.  He had a mushroom catheter placed in his left hemithorax and we had converted that to open drainage.  He comes into the clinic today stating that he is not much better.  He fell and hurt his back and this is limited his mobility.  He continues to smoke approximately 6 cigarettes/day.  He is short of breath at rest.  There is some purulent drainage from the tube and around the tube.  We did get a chest x-ray today and that shows a left hydropneumothorax which is essentially unchanged from before.  Because the patient is uninsured he was unable to meet with the pulmonologist or with the pain management specialist.  We will therefore need to address those issues.  I did review with the daughter and she states that he has a very poor appetite.  He is only able to eat about a half a doughnut per day.  He does drink 3 Ensure or boost per day.  Otherwise he does not eat any solid foods.  He states he is just not hungry.  He has a chronic cough which she has had for 40 years he states.  This is essentially unchanged.   Past Medical History:  Diagnosis Date  . COPD (chronic obstructive pulmonary disease) (HCC)   . Depression   . GERD (gastroesophageal reflux disease)   . Hypertension     Past Surgical History:  Procedure Laterality Date  . CHEST TUBE INSERTION     left chest  . TRACHEOSTOMY      Family History  Problem Relation Age of Onset  .  Leukemia Mother   . Alcohol abuse Father     Social History Social History   Tobacco Use  . Smoking status: Current Every Day Smoker    Packs/day: 0.25    Types: Cigarettes  . Smokeless tobacco: Never Used  Substance Use Topics  . Alcohol use: Yes    Comment: "too much"  . Drug use: Never    Allergies  Allergen Reactions  . Morphine And Related Hives  . Penicillins Hives    Current Outpatient Medications  Medication Sig Dispense Refill  . budesonide-formoterol (SYMBICORT) 160-4.5 MCG/ACT inhaler Inhale 2 puffs into the lungs 2 (two) times daily. 1 Inhaler 12  . famotidine (PEPCID) 20 MG tablet Take 20 mg by mouth daily.    . folic acid (FOLVITE) 1 MG tablet Take 1 mg by mouth daily.    Marland Kitchen HYDROcodone-acetaminophen (NORCO/VICODIN) 5-325 MG tablet Take 1 tablet by mouth every 4 (four) hours as needed for moderate pain. 20 tablet 0  . ipratropium (ATROVENT HFA) 17 MCG/ACT inhaler Inhale 1 puff into the lungs 4 (four) times daily.    Marland Kitchen levalbuterol (XOPENEX HFA) 45 MCG/ACT inhaler Inhale 1-2 puffs into the lungs every 4 (four) hours as needed for wheezing.    . metoprolol tartrate (LOPRESSOR) 25  MG tablet Take 1 tablet (25 mg total) by mouth 2 (two) times daily. 60 tablet 2  . mirtazapine (REMERON) 15 MG tablet Take 0.5 tablets (7.5 mg total) by mouth at bedtime. 30 tablet 2  . polyethylene glycol (MIRALAX / GLYCOLAX) 17 g packet Take 17 g by mouth daily.    . QUEtiapine (SEROQUEL) 50 MG tablet Take 1 tablet (50 mg total) by mouth at bedtime. 30 tablet 2  . senna (SENOKOT) 8.6 MG tablet Take 1 tablet by mouth daily.    . sertraline (ZOLOFT) 50 MG tablet Take 1 tablet (50 mg total) by mouth daily. 30 tablet 2  . thiamine 100 MG tablet Take 100 mg by mouth daily.    . vitamin C (ASCORBIC ACID) 500 MG tablet Take 500 mg by mouth daily.     No current facility-administered medications for this visit.     Location, Quality, Duration, Severity, Timing, Context, Modifying Factors,  Associated Signs and Symptoms.  Review of Systems A complete review of systems was asked and was negative except for the following positive findings shortness of breath, failure to thrive  Blood pressure (!) 141/84, pulse 61, temperature 97.7 F (36.5 C), temperature source Temporal, height 5\' 11"  (1.803 m), weight 100 lb (45.4 kg), SpO2 (!) 88 %.  Physical Exam CONSTITUTIONAL:  Pleasant, well-developed, cachectic, and in no acute distress. EYES: Pupils equal and reactive to light, Sclera non-icteric EARS, NOSE, MOUTH AND THROAT:  The oropharynx was clear.  Dentition is poor repair.  Oral mucosa pink and moist. LYMPH NODES:  Lymph nodes in the neck and axillae were normal RESPIRATORY:  Lungs were very distant.  Normal respiratory effort without pathologic use of accessory muscles of respiration CARDIOVASCULAR: Heart was regular without murmurs.  There were no carotid bruits. GI: The abdomen was soft, nontender, and nondistended. There were no palpable masses. There was no hepatosplenomegaly. There were normal bowel sounds in all quadrants. GU:   MUSCULOSKELETAL:  Normal muscle strength and tone.  No clubbing or cyanosis.   SKIN:  There were no pathologic skin lesions.  There were no nodules on palpation. NEUROLOGIC:  Sensation is normal.  Cranial nerves are grossly intact. PSYCH:  Oriented to person, place and time.  Mood and affect are normal.  I did resecure his drain.  There is pus draining from around the gastrostomy tube.  There is a large air lea  Data Reviewed Chest x-ray  I have personally reviewed the patient's imaging and medical records.    Assessment    Empyema    Plan    We will admit the patient to the hospital.  He needs an extensive evaluation by multiple specialists and he will ultimately require surgical debridement.  I think it would be best if he comes in the hospital today for further management.

## 2019-06-11 LAB — PROCALCITONIN: Procalcitonin: 0.14 ng/mL

## 2019-06-11 LAB — SARS CORONAVIRUS 2 (TAT 6-24 HRS): SARS Coronavirus 2: NEGATIVE

## 2019-06-11 MED ORDER — DIPHENHYDRAMINE HCL 50 MG/ML IJ SOLN: 25.0000 mg | Freq: Three times a day (TID) | INTRAMUSCULAR | Status: DC

## 2019-06-11 MED ORDER — SODIUM CHLORIDE 0.9 % IV SOLN
INTRAVENOUS | Status: DC | PRN
  Administered 2019-06-11: 30 mL via INTRAVENOUS
  Administered 2019-06-12 – 2019-06-13 (×2): 250 mL via INTRAVENOUS

## 2019-06-11 MED ORDER — DIPHENHYDRAMINE HCL 25 MG PO CAPS
25.0000 mg | ORAL_CAPSULE | Freq: Three times a day (TID) | ORAL | Status: DC
  Administered 2019-06-12 – 2019-06-13 (×2): 25 mg via ORAL
  Filled 2019-06-11 (×8): qty 1

## 2019-06-11 MED ORDER — MORPHINE SULFATE (PF) 2 MG/ML IV SOLN
2.0000 mg | INTRAVENOUS | Status: DC | PRN
  Administered 2019-06-11 – 2019-06-12 (×2): 2 mg via INTRAVENOUS
  Filled 2019-06-11 (×3): qty 1

## 2019-06-11 MED ORDER — HYDROCODONE-ACETAMINOPHEN 5-325 MG PO TABS
1.0000 | ORAL_TABLET | ORAL | Status: DC | PRN
  Administered 2019-06-11 – 2019-06-14 (×8): 1 via ORAL
  Filled 2019-06-11 (×8): qty 1

## 2019-06-11 MED ORDER — LEVOFLOXACIN IN D5W 750 MG/150ML IV SOLN
750.0000 mg | INTRAVENOUS | Status: DC
  Administered 2019-06-11 – 2019-06-13 (×3): 750 mg via INTRAVENOUS
  Administered 2019-06-14: 500 mg via INTRAVENOUS
  Filled 2019-06-11 (×4): qty 150

## 2019-06-11 NOTE — Progress Notes (Signed)
[facility] Hospital Day(s): 1.   Post op day(s):  Marland Kitchen   Interval History: Patient seen and examined, no acute events or new complaints overnight. Patient reports continued having pain the tube on the left side of the chest.  Denies shortness of breath.  Denies nausea or vomiting.  The pain does not radiate to other part of the body.  There is no alleviating or aggravating factor.   Vital signs in last 24 hours: [min-max] current  Temp:  [97.9 F (36.6 C)-99.3 F (37.4 C)] 97.9 F (36.6 C) (10/24 0502) Pulse Rate:  [88-109] 89 (10/24 0922) Resp:  [16-24] 20 (10/24 0922) BP: (102-116)/(64-85) 116/66 (10/24 0922) SpO2:  [98 %-100 %] 98 % (10/24 0922) Weight:  [45.4 kg-45.5 kg] 45.5 kg (10/24 0442)     Height: 5\' 11"  (180.3 cm) Weight: 45.5 kg BMI (Calculated): 14   Chest tube: 620 mL in last 24 hours (purulent)  Physical Exam:  Constitutional: alert, cooperative and no distress  Respiratory: breathing non-labored at rest.  Chest tube in place on the left chest.  No sign of wound infection. Cardiovascular: regular rate and sinus rhythm  Gastrointestinal: soft, non-tender, and non-distended  Labs:  CBC Latest Ref Rng & Units 06/10/2019 05/09/2019  WBC 4.0 - 10.5 K/uL 16.2(H) 12.9(H)  Hemoglobin 13.0 - 17.0 g/dL 10.1(L) 9.8(L)  Hematocrit 39.0 - 52.0 % 31.3(L) 31.6(L)  Platelets 150 - 400 K/uL 294 110(L)   CMP Latest Ref Rng & Units 06/10/2019 05/11/2019 05/09/2019  Glucose 70 - 99 mg/dL 99 - 114(H)  BUN 8 - 23 mg/dL 21 - 10  Creatinine 0.61 - 1.24 mg/dL 0.83 0.66 0.89  Sodium 135 - 145 mmol/L 134(L) - 136  Potassium 3.5 - 5.1 mmol/L 4.2 - 4.8  Chloride 98 - 111 mmol/L 93(L) - 100  CO2 22 - 32 mmol/L 24 - 24  Calcium 8.9 - 10.3 mg/dL 10.2 - 9.3  Total Protein 6.5 - 8.1 g/dL 8.6(H) - 8.4(H)  Total Bilirubin 0.3 - 1.2 mg/dL 0.4 - 0.6  Alkaline Phos 38 - 126 U/L 87 - 111  AST 15 - 41 U/L 18 - 15  ALT 0 - 44 U/L 15 - 7    Imaging studies: No new pertinent imaging  studies   Assessment/Plan:  63 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 Patient with chronic emphysema with a complicated previous treatment history.  Today with persistent pain on the chest tube area.  I will optimize his pain medication.  He was taking Norco last admission.  We will restart the Norco to see if this helps with the patient pain.  Patient wishes to be in place that is draining by gravity and working adequately.  Patient needs to be admitted to be completely optimized for surgical management of the chronic empyema.  He has been tried to be done as outpatient but due to social issues patient has not been able to be adequately optimized.  Will follow recommendation by pulmonologist and infectious disease doctor and will appreciate hospitalist evaluation for medical management of his medical conditions.   Arnold Long, MD

## 2019-06-12 LAB — PROCALCITONIN: Procalcitonin: 0.11 ng/mL
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
## 2019-06-12 NOTE — Progress Notes (Signed)
RT note:  Patient refuses to [facility], stating that his breathing is fine.  Instructed on flutter with patient able to complete without assistance.  Patient states he does not need any further respiratory services.

## 2019-06-12 NOTE — Progress Notes (Signed)
[facility] Day(s): 2.   Post op day(s):  Marland Kitchen   Interval History: Patient seen and examined, no acute events or new complaints overnight. Patient reports feeling better today.  He reported the pain is better controlled with the Norco.  He denies shortness of breath.  Denies any significant pain.  There is no pain radiation.  There is no alleviating or aggravating factor.  Denies fever or chills.  Vital signs in last 24 hours: [min-max] current  Temp:  [97.8 F (36.6 C)-99 F (37.2 C)] 98.5 F (36.9 C) (10/25 1956) Pulse Rate:  [80-91] 86 (10/25 1956) Resp:  [17] 17 (10/25 1956) BP: (104-112)/(65-75) 106/72 (10/25 1956) SpO2:  [98 %-100 %] 98 % (10/25 1956) Weight:  [47.5 kg] 47.5 kg (10/25 0500)     Height: 5\' 11"  (180.3 cm) Weight: 47.5 kg BMI (Calculated): 14.61   Physical Exam:  Constitutional: alert, cooperative and no distress  Respiratory: breathing non-labored at rest  Cardiovascular: regular rate and sinus rhythm  Gastrointestinal: soft, non-tender, and non-distended  Labs:  CBC Latest Ref Rng & Units 06/10/2019 05/09/2019  WBC 4.0 - 10.5 K/uL 16.2(H) 12.9(H)  Hemoglobin 13.0 - 17.0 g/dL 10.1(L) 9.8(L)  Hematocrit 39.0 - 52.0 % 31.3(L) 31.6(L)  Platelets 150 - 400 K/uL 294 110(L)   CMP Latest Ref Rng & Units 06/10/2019 05/11/2019 05/09/2019  Glucose 70 - 99 mg/dL 99 - 114(H)  BUN 8 - 23 mg/dL 21 - 10  Creatinine 0.61 - 1.24 mg/dL 0.83 0.66 0.89  Sodium 135 - 145 mmol/L 134(L) - 136  Potassium 3.5 - 5.1 mmol/L 4.2 - 4.8  Chloride 98 - 111 mmol/L 93(L) - 100  CO2 22 - 32 mmol/L 24 - 24  Calcium 8.9 - 10.3 mg/dL 10.2 - 9.3  Total Protein 6.5 - 8.1 g/dL 8.6(H) - 8.4(H)  Total Bilirubin 0.3 - 1.2 mg/dL 0.4 - 0.6  Alkaline Phos 38 - 126 U/L 87 - 111  AST 15 - 41 U/L 18 - 15  ALT 0 - 44 U/L 15 - 7    Imaging studies: No new pertinent imaging studies   Assessment/Plan:  63 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 Patient continued to be stable.  There has been no shortness of breath.  Chest tube continues to drain by gravity.  The pain is better controlled with Norco.  Will continue with current management.  Will continue optimizing patient nutritionally and pulmonary for our definite surgical management of chronic empyema.  Dr. Dorene Ar will continue with the care of this patient.  Patient encouraged to perform incentive spirometer.  We will continue with DVT prophylaxis.  Pending hospitalist and ID evaluation and recommendations.  As per pulmonology, COPD is under control.  Arnold Long, MD

## 2019-06-12 NOTE — Plan of Care (Signed)
Drain sample obtained as the color is milky green. PRN pain medications were provided for pain.  Problem: Education: Goal: Knowledge of General Education information will improve Description: Including pain rating scale, medication(s)/side effects and non-pharmacologic comfort measures Outcome: Not Progressing   Problem: Health Behavior/Discharge Planning: Goal: Ability to manage health-related needs will improve Outcome: Not Progressing   Problem: Clinical Measurements: Goal: Ability to maintain clinical measurements within normal limits will improve Outcome: Not Progressing Goal: Will remain free from infection Outcome: Not Progressing Goal: Diagnostic test results will improve Outcome: Not Progressing Goal: Respiratory complications will improve Outcome: Not Progressing Goal: Cardiovascular complication will be avoided Outcome: Not Progressing   Problem: Clinical Measurements: Goal: Will remain free from infection Outcome: Not Progressing   Problem: Activity: Goal: Risk for activity intolerance will decrease Outcome: Not Progressing   Problem: Nutrition: Goal: Adequate nutrition will be maintained Outcome: Not Progressing   Problem: Coping: Goal: Level of anxiety will decrease Outcome: Not Progressing   Problem: Elimination: Goal: Will not experience complications related to bowel motility Outcome: Not Progressing Goal: Will not experience complications related to urinary retention Outcome: Not Progressing   Problem: Pain Managment: Goal: General experience of comfort will improve Outcome: Not Progressing   Problem: Safety: Goal: Ability to remain free from injury will improve Outcome: Not Progressing   Problem: Skin Integrity: Goal: Risk for impaired skin integrity will decrease Outcome: Not Progressing

## 2019-06-13 ENCOUNTER — Encounter: Payer: Self-pay | Admitting: Anesthesiology

## 2019-06-13 ENCOUNTER — Inpatient Hospital Stay: Payer: Medicaid Other

## 2019-06-13 LAB — COMPREHENSIVE METABOLIC PANEL
ALT: 13 U/L (ref 0–44)
AST: 20 U/L (ref 15–41)
Albumin: 2.5 g/dL — ABNORMAL LOW (ref 3.5–5.0)
Alkaline Phosphatase: 85 U/L (ref 38–126)
Anion gap: 12 (ref 5–15)
BUN: 31 mg/dL — ABNORMAL HIGH (ref 8–23)
CO2: 27 mmol/L (ref 22–32)
Calcium: 9.8 mg/dL (ref 8.9–10.3)
Chloride: 95 mmol/L — ABNORMAL LOW (ref 98–111)
Creatinine, Ser: 0.85 mg/dL (ref 0.61–1.24)
GFR calc Af Amer: >60 mL/min (ref >60)
GFR calc non Af Amer: >60 mL/min (ref >60)
Glucose, Bld: 96 mg/dL (ref 70–99)
Potassium: 4.1 mmol/L (ref 3.5–5.1)
Sodium: 134 mmol/L — ABNORMAL LOW (ref 135–145)
Total Bilirubin: 0.3 mg/dL (ref 0.3–1.2)
Total Protein: 7.5 g/dL (ref 6.5–8.1)

## 2019-06-13 LAB — CBC WITH DIFFERENTIAL/PLATELET
Abs Immature Granulocytes: 0.08 10*3/uL — ABNORMAL HIGH (ref 0.00–0.07)
Basophils Absolute: 0 10*3/uL (ref 0.0–0.1)
Basophils Relative: 0 %
Eosinophils Absolute: 0.2 10*3/uL (ref 0.0–0.5)
Eosinophils Relative: 2 %
HCT: 28.3 % — ABNORMAL LOW (ref 39.0–52.0)
Hemoglobin: 9 g/dL — ABNORMAL LOW (ref 13.0–17.0)
Immature Granulocytes: 1 %
Lymphocytes Relative: 10 %
Lymphs Abs: 1 10*3/uL (ref 0.7–4.0)
MCH: 28.6 pg (ref 26.0–34.0)
MCHC: 31.8 g/dL (ref 30.0–36.0)
MCV: 89.8 fL (ref 80.0–100.0)
Monocytes Absolute: 0.9 10*3/uL (ref 0.1–1.0)
Monocytes Relative: 9 %
Neutro Abs: 7.6 10*3/uL (ref 1.7–7.7)
Neutrophils Relative %: 78 %
Platelets: 471 10*3/uL — ABNORMAL HIGH (ref 150–400)
RBC: 3.15 MIL/uL — ABNORMAL LOW (ref 4.22–5.81)
RDW: 13.6 % (ref 11.5–15.5)
WBC: 9.7 10*3/uL (ref 4.0–10.5)
nRBC: 0 % (ref 0.0–0.2)

## 2019-06-13 LAB — ABO/RH: ABO/RH(D): O POS
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
## 2019-06-13 NOTE — TOC Initial Note (Signed)
Transition of Care [facility]) - Initial/Assessment Note    Patient Details  Name: Tom Mcclure MRN: [id number] Date of Birth: [birth year]  Transition of Care [facility]) CM/SW Contact:    Beverly Sessions, RN Phone Number: 06/13/2019, 4:29 PM  Clinical Narrative:                 [facility] consult for home health, and medication needs.  Per MD plan for OR on Thursday.  RNCM to complete assessment 10/27 and will be following for discharge planning needs         Patient Goals and CMS Choice        Expected Discharge Plan and Services                                                Prior Living Arrangements/Services                       Activities of Daily Living Home Assistive Devices/Equipment: None ADL Screening (condition at time of admission) Patient's cognitive ability adequate to safely complete daily activities?: Yes Is the patient deaf or have difficulty hearing?: No Does the patient have difficulty seeing, even when wearing glasses/contacts?: No Does the patient have difficulty concentrating, remembering, or making decisions?: No Patient able to express need for assistance with ADLs?: Yes Does the patient have difficulty dressing or bathing?: Yes Independently performs ADLs?: No Communication: Independent Dressing (OT): Needs assistance Is this a change from baseline?: Pre-admission baseline Grooming: Needs assistance Is this a change from baseline?: Pre-admission baseline Feeding: Independent Bathing: Needs assistance Is this a change from baseline?: Pre-admission baseline Toileting: Needs assistance Is this a change from baseline?: Pre-admission baseline In/Out Bed: Needs assistance Is this a change from baseline?: Pre-admission baseline Walks in Home: Needs assistance Is this a change from baseline?: Pre-admission baseline Does the patient have difficulty walking or climbing stairs?: Yes Weakness of Legs: Both Weakness of Arms/Hands:  Both  Permission Sought/Granted                  Emotional Assessment              Admission diagnosis:  emypema Patient Active Problem List   Diagnosis Date Noted  . Protein-calorie malnutrition, severe 05/11/2019  . Empyema [facility])    PCP:  Patient, No Pcp Per Pharmacy:   [id number] - [place], [place] - [address] [place] [address] Phone: [phone] Fax: [place] [place] [address] Phone: [phone] Fax: [phone]     Social Determinants of Health (SDOH) Interventions    Readmission Risk Interventions No flowsheet data found.

## 2019-06-13 NOTE — Progress Notes (Signed)
Patient ID: Tom Mcclure, male   DOB: 09-Feb-1956, 63 y.o.   MRN: [id number]  Overall he feels much better since he has been in the hospital.  He is eating well.  He continues to drain purulent material from his chest drain.  His lungs show markedly diminished breath sounds throughout.  His heart is regular.  The dressings on the chest tube site are clean dry.  I reviewed with him in great detail the indications and risk of chest wall resection with open packing of his empyema cavity (Eloesser flap).  I discussed with him the risks of this surgical procedure including risks of chronic ventilator dependence, bleeding, infection, air leak, pneumothorax and death.  I have spoken with the daughter on multiple occasions about the need for commitment to dressing changes.  She understands that this is a task that will require considerable effort on her part and for a prolonged period of time.  I explained to her that this could take many months to heal or may be a chronic wound.  The patient is a poor risk for any other surgical intervention.  He certainly is not a candidate for major thoracotomy.  He understands and would like [place] to proceed.

## 2019-06-14 ENCOUNTER — Inpatient Hospital Stay: Payer: Medicaid Other | Admitting: Anesthesiology

## 2019-06-14 ENCOUNTER — Inpatient Hospital Stay: Payer: Medicaid Other

## 2019-06-14 ENCOUNTER — Inpatient Hospital Stay
Admission: AD | Admit: 2019-06-14 | Discharge: 2019-06-14 | Disposition: A | Discharge status: Home / Self Care | Payer: Medicaid Other | Source: Ambulatory Visit | Attending: Cardiothoracic Surgery | Admitting: Cardiothoracic Surgery

## 2019-06-14 ENCOUNTER — Encounter
Admission: AD | Disposition: A | Discharge status: Home Health | Payer: Self-pay | Source: Ambulatory Visit | Attending: Cardiothoracic Surgery

## 2019-06-14 DIAGNOSIS — J869 Pyothorax without fistula: Secondary | ICD-10-CM

## 2019-06-14 HISTORY — PX: ELOESSOR: SHX5098

## 2019-06-14 LAB — CBC
HCT: 29.5 % — ABNORMAL LOW (ref 39.0–52.0)
Hemoglobin: 9.2 g/dL — ABNORMAL LOW (ref 13.0–17.0)
MCH: 28.8 pg (ref 26.0–34.0)
MCHC: 31.2 g/dL (ref 30.0–36.0)
MCV: 92.2 fL (ref 80.0–100.0)
Platelets: 475 10*3/uL — ABNORMAL HIGH (ref 150–400)
RBC: 3.2 MIL/uL — ABNORMAL LOW (ref 4.22–5.81)
RDW: 13.6 % (ref 11.5–15.5)
WBC: 16.7 10*3/uL — ABNORMAL HIGH (ref 4.0–10.5)
nRBC: 0 % (ref 0.0–0.2)

## 2019-06-14 LAB — BASIC METABOLIC PANEL
Anion gap: 10 (ref 5–15)
BUN: 29 mg/dL — ABNORMAL HIGH (ref 8–23)
CO2: 28 mmol/L (ref 22–32)
Calcium: 9.7 mg/dL (ref 8.9–10.3)
Chloride: 97 mmol/L — ABNORMAL LOW (ref 98–111)
Creatinine, Ser: 1 mg/dL (ref 0.61–1.24)
GFR calc Af Amer: >60 mL/min (ref >60)
GFR calc non Af Amer: >60 mL/min (ref >60)
Glucose, Bld: 137 mg/dL — ABNORMAL HIGH (ref 70–99)
Potassium: 4.4 mmol/L (ref 3.5–5.1)
Sodium: 135 mmol/L (ref 135–145)

## 2019-06-14 LAB — MRSA PCR SCREENING: MRSA by PCR: POSITIVE — AB

## 2019-06-14 SURGERY — CREATION, FLAP, ELOESSER
Anesthesia: General | Laterality: Left

## 2019-06-14 MED ORDER — BUPIVACAINE HCL (PF) 0.25 % IJ SOLN
INTRAMUSCULAR | Status: AC
  Filled 2019-06-14: qty 30

## 2019-06-14 MED ORDER — KCL IN DEXTROSE-NACL 20-5-0.45 MEQ/L-%-% IV SOLN
INTRAVENOUS | Status: DC
  Administered 2019-06-14: 22:00:00 via INTRAVENOUS
  Filled 2019-06-14 (×4): qty 1000

## 2019-06-14 MED ORDER — IPRATROPIUM BROMIDE HFA 17 MCG/ACT IN AERS
1.0000 | INHALATION_SPRAY | Freq: Two times a day (BID) | RESPIRATORY_TRACT | Status: DC
  Filled 2019-06-14: qty 12.9

## 2019-06-14 MED ORDER — HYDROMORPHONE HCL 1 MG/ML IJ SOLN
INTRAMUSCULAR | Status: AC
  Filled 2019-06-14: qty 1

## 2019-06-14 MED ORDER — MIRTAZAPINE 15 MG PO TABS
7.5000 mg | ORAL_TABLET | Freq: Every day | ORAL | Status: DC
  Administered 2019-06-14 – 2019-06-18 (×5): 7.5 mg via ORAL
  Filled 2019-06-14 (×5): qty 1

## 2019-06-14 MED ORDER — PROPOFOL 10 MG/ML IV BOLUS
INTRAVENOUS | Status: DC | PRN
  Administered 2019-06-14: 80 mg via INTRAVENOUS

## 2019-06-14 MED ORDER — HYDROMORPHONE HCL 1 MG/ML IJ SOLN
INTRAMUSCULAR | Status: AC
  Administered 2019-06-14: 0.25 mg via INTRAVENOUS
  Filled 2019-06-14: qty 1

## 2019-06-14 MED ORDER — LACTATED RINGERS IV SOLN
INTRAVENOUS | Status: DC
  Administered 2019-06-14: 12:00:00 via INTRAVENOUS

## 2019-06-14 MED ORDER — DEXMEDETOMIDINE HCL 200 MCG/2ML IV SOLN
INTRAVENOUS | Status: DC | PRN
  Administered 2019-06-14: 8 ug via INTRAVENOUS

## 2019-06-14 MED ORDER — ACETAMINOPHEN 10 MG/ML IV SOLN
INTRAVENOUS | Status: DC | PRN
  Administered 2019-06-14: 1000 mg via INTRAVENOUS

## 2019-06-14 MED ORDER — MIDAZOLAM HCL 2 MG/2ML IJ SOLN
INTRAMUSCULAR | Status: AC
  Filled 2019-06-14: qty 2

## 2019-06-14 MED ORDER — VASOPRESSIN 20 UNIT/ML IV SOLN
INTRAVENOUS | Status: AC
  Filled 2019-06-14: qty 1

## 2019-06-14 MED ORDER — DAKINS (1/4 STRENGTH) 0.125 % EX SOLN
CUTANEOUS | Status: DC | PRN
  Administered 2019-06-14: 1 via TOPICAL

## 2019-06-14 MED ORDER — ROCURONIUM BROMIDE 100 MG/10ML IV SOLN
INTRAVENOUS | Status: DC | PRN
  Administered 2019-06-14 (×2): 20 mg via INTRAVENOUS
  Administered 2019-06-14: 50 mg via INTRAVENOUS

## 2019-06-14 MED ORDER — PHENYLEPHRINE HCL (PRESSORS) 10 MG/ML IV SOLN
INTRAVENOUS | Status: DC | PRN
  Administered 2019-06-14: 100 ug via INTRAVENOUS
  Administered 2019-06-14: 200 ug via INTRAVENOUS
  Administered 2019-06-14: 100 ug via INTRAVENOUS

## 2019-06-14 MED ORDER — FENTANYL CITRATE (PF) 100 MCG/2ML IJ SOLN
25.0000 ug | INTRAMUSCULAR | Status: AC | PRN
  Administered 2019-06-14 (×6): 25 ug via INTRAVENOUS

## 2019-06-14 MED ORDER — LEVOFLOXACIN IN D5W 500 MG/100ML IV SOLN
INTRAVENOUS | Status: AC
  Filled 2019-06-14: qty 100

## 2019-06-14 MED ORDER — CHLORHEXIDINE GLUCONATE CLOTH 2 % EX PADS
6.0000 | MEDICATED_PAD | Freq: Every day | CUTANEOUS | Status: DC
  Administered 2019-06-14: 6 via TOPICAL

## 2019-06-14 MED ORDER — EPHEDRINE SULFATE 50 MG/ML IJ SOLN
INTRAMUSCULAR | Status: DC | PRN
  Administered 2019-06-14 (×3): 5 mg via INTRAVENOUS

## 2019-06-14 MED ORDER — LEVALBUTEROL TARTRATE 45 MCG/ACT IN AERO
1.0000 | INHALATION_SPRAY | RESPIRATORY_TRACT | Status: DC | PRN
  Filled 2019-06-14: qty 15

## 2019-06-14 MED ORDER — VITAMIN C 500 MG PO TABS
500.0000 mg | ORAL_TABLET | Freq: Every day | ORAL | Status: DC
  Administered 2019-06-14 – 2019-06-19 (×6): 500 mg via ORAL
  Filled 2019-06-14 (×6): qty 1

## 2019-06-14 MED ORDER — SODIUM CHLORIDE 0.9% IV SOLUTION: Freq: Once | INTRAVENOUS | Status: DC

## 2019-06-14 MED ORDER — DEXAMETHASONE SODIUM PHOSPHATE 10 MG/ML IJ SOLN
INTRAMUSCULAR | Status: AC
  Filled 2019-06-14: qty 1

## 2019-06-14 MED ORDER — DAKINS (1/4 STRENGTH) 0.125 % EX SOLN
CUTANEOUS | Status: AC
  Filled 2019-06-14: qty 473

## 2019-06-14 MED ORDER — LIDOCAINE HCL (CARDIAC) PF 100 MG/5ML IV SOSY
PREFILLED_SYRINGE | INTRAVENOUS | Status: DC | PRN
  Administered 2019-06-14: 60 mg via INTRAVENOUS

## 2019-06-14 MED ORDER — ACETAMINOPHEN 10 MG/ML IV SOLN
INTRAVENOUS | Status: AC
  Filled 2019-06-14: qty 100

## 2019-06-14 MED ORDER — SERTRALINE HCL 50 MG PO TABS
50.0000 mg | ORAL_TABLET | Freq: Every day | ORAL | Status: DC
  Administered 2019-06-14 – 2019-06-19 (×6): 50 mg via ORAL
  Filled 2019-06-14 (×6): qty 1

## 2019-06-14 MED ORDER — GLYCOPYRROLATE 0.2 MG/ML IJ SOLN
INTRAMUSCULAR | Status: DC | PRN
  Administered 2019-06-14: 0.2 mg via INTRAVENOUS

## 2019-06-14 MED ORDER — BISACODYL 5 MG PO TBEC
10.0000 mg | DELAYED_RELEASE_TABLET | Freq: Every day | ORAL | Status: DC
  Administered 2019-06-14 – 2019-06-19 (×5): 10 mg via ORAL
  Filled 2019-06-14 (×6): qty 2

## 2019-06-14 MED ORDER — FENTANYL CITRATE (PF) 100 MCG/2ML IJ SOLN
INTRAMUSCULAR | Status: DC | PRN
  Administered 2019-06-14 (×2): 50 ug via INTRAVENOUS

## 2019-06-14 MED ORDER — SUGAMMADEX SODIUM 500 MG/5ML IV SOLN
INTRAVENOUS | Status: AC
  Filled 2019-06-14: qty 10

## 2019-06-14 MED ORDER — DEXTROSE-NACL 5-0.45 % IV SOLN
INTRAVENOUS | Status: DC
  Administered 2019-06-15: 1000 mL via INTRAVENOUS
  Administered 2019-06-16 – 2019-06-18 (×4): via INTRAVENOUS

## 2019-06-14 MED ORDER — QUETIAPINE FUMARATE 25 MG PO TABS
50.0000 mg | ORAL_TABLET | Freq: Every day | ORAL | Status: DC
  Administered 2019-06-14 – 2019-06-18 (×5): 50 mg via ORAL
  Filled 2019-06-14 (×5): qty 2

## 2019-06-14 MED ORDER — FOLIC ACID 1 MG PO TABS
1.0000 mg | ORAL_TABLET | Freq: Every day | ORAL | Status: DC
  Administered 2019-06-14 – 2019-06-19 (×6): 1 mg via ORAL
  Filled 2019-06-14 (×6): qty 1

## 2019-06-14 MED ORDER — BUPIVACAINE HCL 0.25 % IJ SOLN
INTRAMUSCULAR | Status: DC | PRN
  Administered 2019-06-14: 30 mL

## 2019-06-14 MED ORDER — BUPIVACAINE LIPOSOME 1.3 % IJ SUSP
INTRAMUSCULAR | Status: DC | PRN
  Administered 2019-06-14: 20 mL

## 2019-06-14 MED ORDER — ONDANSETRON HCL 4 MG/2ML IJ SOLN
INTRAMUSCULAR | Status: DC | PRN
  Administered 2019-06-14 (×2): 4 mg via INTRAVENOUS

## 2019-06-14 MED ORDER — FENTANYL CITRATE (PF) 100 MCG/2ML IJ SOLN
INTRAMUSCULAR | Status: AC
  Filled 2019-06-14: qty 2

## 2019-06-14 MED ORDER — DEXAMETHASONE SODIUM PHOSPHATE 10 MG/ML IJ SOLN
INTRAMUSCULAR | Status: DC | PRN
  Administered 2019-06-14: 10 mg via INTRAVENOUS

## 2019-06-14 MED ORDER — SODIUM CHLORIDE FLUSH 0.9 % IV SOLN
INTRAVENOUS | Status: AC
  Filled 2019-06-14: qty 10

## 2019-06-14 MED ORDER — GLYCOPYRROLATE 0.2 MG/ML IJ SOLN
INTRAMUSCULAR | Status: AC
  Filled 2019-06-14: qty 1

## 2019-06-14 MED ORDER — MIDAZOLAM HCL 2 MG/2ML IJ SOLN
INTRAMUSCULAR | Status: DC | PRN
  Administered 2019-06-14: 2 mg via INTRAVENOUS

## 2019-06-14 MED ORDER — FENTANYL CITRATE (PF) 100 MCG/2ML IJ SOLN
25.0000 ug | INTRAMUSCULAR | Status: DC | PRN
  Administered 2019-06-15 – 2019-06-19 (×5): 50 ug via INTRAVENOUS
  Filled 2019-06-14 (×2): qty 2

## 2019-06-14 MED ORDER — MUPIROCIN 2 % EX OINT
1.0000 "application " | TOPICAL_OINTMENT | Freq: Two times a day (BID) | CUTANEOUS | Status: DC
  Administered 2019-06-14: 1 via NASAL
  Filled 2019-06-14: qty 22

## 2019-06-14 MED ORDER — LACTATED RINGERS IV SOLN
INTRAVENOUS | Status: DC | PRN
  Administered 2019-06-14: 13:00:00 via INTRAVENOUS

## 2019-06-14 MED ORDER — SODIUM CHLORIDE 0.9 % IV SOLN
INTRAVENOUS | Status: DC | PRN
  Administered 2019-06-14: 25 ug/min via INTRAVENOUS

## 2019-06-14 MED ORDER — HYDROCODONE-ACETAMINOPHEN 5-325 MG PO TABS
ORAL_TABLET | ORAL | Status: AC
  Administered 2019-06-15: 1 via ORAL
  Filled 2019-06-14: qty 1

## 2019-06-14 MED ORDER — SUGAMMADEX SODIUM 500 MG/5ML IV SOLN
INTRAVENOUS | Status: DC | PRN
  Administered 2019-06-14: 186.4 mg via INTRAVENOUS

## 2019-06-14 MED ORDER — FENTANYL CITRATE (PF) 100 MCG/2ML IJ SOLN
INTRAMUSCULAR | Status: AC
  Administered 2019-06-14: 25 ug via INTRAVENOUS
  Filled 2019-06-14: qty 2

## 2019-06-14 MED ORDER — EPHEDRINE SULFATE 50 MG/ML IJ SOLN
INTRAMUSCULAR | Status: AC
  Filled 2019-06-14: qty 1

## 2019-06-14 MED ORDER — LIDOCAINE HCL (PF) 2 % IJ SOLN
INTRAMUSCULAR | Status: AC
  Filled 2019-06-14: qty 10

## 2019-06-14 MED ORDER — ONDANSETRON HCL 4 MG/2ML IJ SOLN: 4.0000 mg | Freq: Four times a day (QID) | INTRAMUSCULAR | Status: DC | PRN

## 2019-06-14 MED ORDER — BUPIVACAINE LIPOSOME 1.3 % IJ SUSP
INTRAMUSCULAR | Status: AC
  Filled 2019-06-14: qty 20

## 2019-06-14 MED ORDER — ONDANSETRON HCL 4 MG/2ML IJ SOLN
INTRAMUSCULAR | Status: AC
  Filled 2019-06-14: qty 2

## 2019-06-14 MED ORDER — HYDROMORPHONE HCL 1 MG/ML IJ SOLN
0.2500 mg | INTRAMUSCULAR | Status: AC | PRN
  Administered 2019-06-14: 0.25 mg via INTRAVENOUS
  Administered 2019-06-14: 0.5 mg via INTRAVENOUS
  Administered 2019-06-14 (×4): 0.25 mg via INTRAVENOUS
  Administered 2019-06-14: 0.5 mg via INTRAVENOUS
  Administered 2019-06-14: 0.25 mg via INTRAVENOUS

## 2019-06-14 SURGICAL SUPPLY — 76 items
BENZOIN TINCTURE PRP APPL 2/3 (GAUZE/BANDAGES/DRESSINGS) ×1 IMPLANT
BNDG COHESIVE 4X5 TAN STRL (GAUZE/BANDAGES/DRESSINGS) IMPLANT
BNDG GAUZE 4.5X4.1 6PLY STRL (MISCELLANEOUS) ×1 IMPLANT
BRONCHOSCOPE PED SLIM DISP (MISCELLANEOUS) ×1 IMPLANT
CANISTER SUCT 1200ML W/VALVE (MISCELLANEOUS) ×3 IMPLANT
CATH URET ROBINSON 16FR STRL (CATHETERS) ×2 IMPLANT
CHLORAPREP W/TINT 26 (MISCELLANEOUS) ×4 IMPLANT
CNTNR SPEC 2.5X3XGRAD LEK (MISCELLANEOUS)
CONT SPEC 4OZ STER OR WHT (MISCELLANEOUS)
CONTAINER SPEC 2.5X3XGRAD LEK (MISCELLANEOUS) ×4 IMPLANT
CUTTER ECHEON FLEX ENDO 45 340 (ENDOMECHANICALS) IMPLANT
DRAIN CHEST DRY SUCT SGL (MISCELLANEOUS) ×1 IMPLANT
DRAPE C-SECTION (MISCELLANEOUS) ×2 IMPLANT
DRAPE MAG INST 16X20 L/F (DRAPES) ×3 IMPLANT
DRSG OPSITE POSTOP 4X6 (GAUZE/BANDAGES/DRESSINGS) ×1 IMPLANT
DRSG OPSITE POSTOP 4X8 (GAUZE/BANDAGES/DRESSINGS) ×1 IMPLANT
DRSG TEGADERM 6X8 (GAUZE/BANDAGES/DRESSINGS) ×4 IMPLANT
DRSG TELFA 3X8 NADH (GAUZE/BANDAGES/DRESSINGS) IMPLANT
ELECT BLADE 6.5 EXT (BLADE) ×2 IMPLANT
ELECT CAUTERY BLADE TIP 2.5 (TIP) ×2
ELECT REM PT RETURN 9FT ADLT (ELECTROSURGICAL) ×2
ELECTRODE CAUTERY BLDE TIP 2.5 (TIP) ×1 IMPLANT
ELECTRODE REM PT RTRN 9FT ADLT (ELECTROSURGICAL) ×1 IMPLANT
GAUZE SPONGE 4X4 12PLY STRL (GAUZE/BANDAGES/DRESSINGS) ×1 IMPLANT
GLOVE SURG SYN 6.5 ES PF (GLOVE) ×2 IMPLANT
GLOVE SURG SYN 6.5 PF PI (GLOVE) IMPLANT
GLOVE SURG SYN 7.5  E (GLOVE) ×10
GLOVE SURG SYN 7.5 E (GLOVE) ×10 IMPLANT
GLOVE SURG SYN 7.5 PF PI (GLOVE) ×2 IMPLANT
GOWN STRL REUS W/ TWL LRG LVL3 (GOWN DISPOSABLE) ×3 IMPLANT
GOWN STRL REUS W/TWL LRG LVL3 (GOWN DISPOSABLE) ×6
KIT TURNOVER KIT A (KITS) ×2 IMPLANT
LABEL OR SOLS (LABEL) ×2 IMPLANT
LOOP RED MAXI  1X406MM (MISCELLANEOUS) ×1
LOOP VESSEL MAXI 1X406 RED (MISCELLANEOUS) ×1 IMPLANT
MARKER SKIN DUAL TIP RULER LAB (MISCELLANEOUS) ×2 IMPLANT
NDL SPNL 22GX3.5 QUINCKE BK (NEEDLE) ×1 IMPLANT
NEEDLE HYPO 22GX1.5 SAFETY (NEEDLE) ×1 IMPLANT
NEEDLE SPNL 22GX3.5 QUINCKE BK (NEEDLE) ×2 IMPLANT
PACK BASIN MAJOR ARMC (MISCELLANEOUS) ×2 IMPLANT
PAD ABD DERMACEA PRESS 5X9 (GAUZE/BANDAGES/DRESSINGS) ×2 IMPLANT
PAD DRESSING TELFA 3X8 NADH (GAUZE/BANDAGES/DRESSINGS) ×1 IMPLANT
RELOAD PROXIMATE TA60MM GREEN (ENDOMECHANICALS) IMPLANT
RELOAD STAPLE 35X2.5 WHT THIN (STAPLE) ×4 IMPLANT
RELOAD STAPLE 60 4.7 GRN THCK (ENDOMECHANICALS) IMPLANT
RELOAD STAPLER LINE PROX 30 GR (STAPLE) IMPLANT
SPONGE GAUZE 2X2 8PLY STRL LF (GAUZE/BANDAGES/DRESSINGS) ×1 IMPLANT
SPONGE KITTNER 5P (MISCELLANEOUS) ×2 IMPLANT
STAPLE RELOAD 2.5MM WHITE (STAPLE) IMPLANT
STAPLER RELOAD LINE PROX 30 GR (STAPLE)
STAPLER RELOADABLE 30 GRN THCK (STAPLE) ×1 IMPLANT
STAPLER SKIN PROX 35W (STAPLE) ×2 IMPLANT
STAPLER VASCULAR ECHELON 35 (CUTTER) IMPLANT
STRIP CLOSURE SKIN 1/2X4 (GAUZE/BANDAGES/DRESSINGS) ×1 IMPLANT
SUT ETHILON 3-0 FS-10 30 BLK (SUTURE) ×6
SUT MNCRL AB 3-0 PS2 27 (SUTURE) IMPLANT
SUT PROLENE 5 0 RB 1 DA (SUTURE) IMPLANT
SUT SILK 0 (SUTURE)
SUT SILK 0 30XBRD TIE 6 (SUTURE) ×1 IMPLANT
SUT SILK 1 SH (SUTURE) ×7 IMPLANT
SUT VIC AB 0 CT1 36 (SUTURE) ×4 IMPLANT
SUT VIC AB 2-0 CT1 27 (SUTURE) ×4
SUT VIC AB 2-0 CT1 TAPERPNT 27 (SUTURE) ×2 IMPLANT
SUT VICRYL 2 TP 1 (SUTURE) ×3 IMPLANT
SUTURE EHLN 3-0 FS-10 30 BLK (SUTURE) IMPLANT
SYR 10ML SLIP (SYRINGE) ×1 IMPLANT
SYR 20ML LL LF (SYRINGE) ×1 IMPLANT
SYR BULB IRRIG 60ML STRL (SYRINGE) ×2 IMPLANT
TAPE CLOTH 3X10 WHT NS LF (GAUZE/BANDAGES/DRESSINGS) ×2 IMPLANT
TAPE TRANSPORE STRL 2 31045 (GAUZE/BANDAGES/DRESSINGS) IMPLANT
TRAY FOLEY MTR SLVR 16FR STAT (SET/KITS/TRAYS/PACK) ×1 IMPLANT
TROCAR FLEXIPATH 20X80 (ENDOMECHANICALS) IMPLANT
TROCAR FLEXIPATH THORACIC 15MM (ENDOMECHANICALS) IMPLANT
TUBING CONNECTING 10 (TUBING) ×3 IMPLANT
WATER STERILE IRR 1000ML POUR (IV SOLUTION) ×2 IMPLANT
YANKAUER SUCT BULB TIP FLEX NO (MISCELLANEOUS) ×3 IMPLANT

## 2019-06-14 NOTE — Transfer of Care (Signed)
Immediate Anesthesia Transfer of Care Note  Patient: Tom Mcclure  Procedure(s) Performed: [name] PROCEDURE (Left )  Patient Location: PACU  Anesthesia Type:General  Level of Consciousness: awake and alert   Airway & Oxygen Therapy: Patient Spontanous Breathing and Patient connected to face mask oxygen  Post-op Assessment: Report given to RN and Post -op Vital signs reviewed and stable  Post vital signs: Reviewed and stable  Last Vitals:  Vitals Value Taken Time  BP 113/70 06/14/19 1540  Temp 36.9 C 06/14/19 1540  Pulse 86 06/14/19 1545  Resp 16 06/14/19 1545  SpO2 99 % 06/14/19 1545  Vitals shown include unvalidated device data.  Last Pain:  Vitals:   06/14/19 1127  TempSrc: Temporal  PainSc: 0-No pain         Complications: No apparent anesthesia complications

## 2019-06-14 NOTE — Anesthesia Post-op Follow-up Note (Signed)
Anesthesia QCDR form completed.        

## 2019-06-14 NOTE — Progress Notes (Signed)
Patient ID: Tom Mcclure, male   DOB: 1955/09/12, 63 y.o.   MRN: [id number] patient in the OR. Blood pressure is stable. He does not need to be on any blood pressure medication. I'm discontinuing beta-blockers.

## 2019-06-14 NOTE — Op Note (Signed)
  06/14/2019  3:46 PM  PATIENT:  Tom Mcclure  63 y.o. male  PRE-OPERATIVE DIAGNOSIS: Chronic empyema left chest  POST-OPERATIVE DIAGNOSIS: Chronic empyema left chest  PROCEDURE: Eloesser flap  SURGEON:  Surgeon(s) and Role:    Nestor Lewandowsky, MD - Primary  ASSISTANTS: Loree Fee, PA  ANESTHESIA: General  INDICATIONS FOR PROCEDURE this patient is a 63 year old man who had a necrotizing pneumonia with an empyema that was treated at the [facility] in [place].  He was mechanically ventilated for many weeks after surgery and he was discharged to home with a chronic empyema cavity draining from his chest cavity via a Malecot gastrostomy tube.  After appropriate preoperative evaluation he was found to be a suitable candidate for open drainage of his empyema cavity.  He was not a candidate for more definitive surgical intervention at this time.  The indications and risks of the surgery were explained the patient gives informed consent.  DICTATION: Patient was brought to the operating suite placed in the supine position.  General endotracheal anesthesia was given through a single-lumen tube.  The patient was positioned for a left thoracotomy.  The chest tube was removed.  Patient was then prepped and draped in usual sterile fashion.  We began by initially excising the chest tube site.  This gave [place] access to the chest cavity so that I could feel from the inside the extent of the empyema cavity.  This extended somewhat narrowly towards the apex of the chest and I elected to resect approximately 4 ribs and made a cruciate incision originating from the chest tube site.  We elevated the muscles off the chest wall.  The empyema cavity itself was filled with purulent material.  There are some glistening granulation tissue present on the surface of the lung with multiple holes from which were exiting air with each ventilation.  We established hemostasis and we then turned the 4 corners down  with a heavy nylon suture making a skin line tract into the empyema cavity.  This measured approximately 5 x 5 cm.  The empyema cavity was then packed with Dakin's soaked Kerlix gauze.  The wound was then covered with large ABDs and a burn net was used to secure the dressings in place.  Patient was then rolled in the supine position he was extubated and taken to the recovery room in stable condition.  It should be noted that appropriate cultures and pathology were sent.  Cultures of both bone and soft tissue of the empyema cavity were sent.   Nestor Lewandowsky, MD

## 2019-06-14 NOTE — Anesthesia Procedure Notes (Deleted)
Arterial Line Insertion Start/End10/27/2020 1:45 PM, 06/14/2019 1:55 PM Performed by: Emmie Niemann, MD, Jonna Clark, CRNA, anesthesiologist  Preanesthetic checklist: patient identified, IV checked, site marked, risks and benefits discussed, surgical consent, monitors and equipment checked, pre-op evaluation, timeout performed and anesthesia consent Left, radial was placed Catheter size: 20 G Hand hygiene performed , maximum sterile barriers used  and Seldinger technique used  Attempts: 2 Procedure performed using ultrasound guided technique. Ultrasound Notes:anatomy identified, needle tip was noted to be adjacent to the nerve/plexus identified and no ultrasound evidence of intravascular and/or intraneural injection Following insertion, Biopatch and dressing applied. Post procedure assessment: normal  Patient tolerated the procedure well with no immediate complications.

## 2019-06-14 NOTE — Anesthesia Procedure Notes (Deleted)
Central Venous Catheter Insertion Performed by: Emmie Niemann, MD, anesthesiologist Start/End10/27/2020 12:50 PM, 06/14/2019 1:10 PM Preanesthetic checklist: patient identified, IV checked, site marked, risks and benefits discussed, surgical consent, monitors and equipment checked, pre-op evaluation, timeout performed and anesthesia consent Position: Trendelenburg Hand hygiene performed , maximum sterile barriers used  and Seldinger technique used Catheter size: 8 Fr Total catheter length 16. Central line was placed.Double lumen Procedure performed using ultrasound guided technique. Ultrasound Notes:anatomy identified, needle tip was noted to be adjacent to the nerve/plexus identified, no ultrasound evidence of intravascular and/or intraneural injection and image(s) printed for medical record Attempts: 1 Following insertion, line sutured, Biopatch and dressing applied. Post procedure assessment: blood return through all ports, free fluid flow and no air  Patient tolerated the procedure well with no immediate complications.

## 2019-06-14 NOTE — Progress Notes (Signed)
Nutrition Follow Up Note   DOCUMENTATION CODES:   Severe malnutrition in context of chronic illness  INTERVENTION:   Ensure Enlive po TID, each supplement provides 350 kcal and 20 grams of protein  Magic cup TID with meals, each supplement provides 290 kcal and 9 grams of protein  MVI daily   Pt likely moderate refeed risk; recommend monitor K, Mg and P labs   NUTRITION DIAGNOSIS:   Severe Malnutrition related to chronic illness(COPD) as evidenced by severe muscle depletion, severe fat depletion.  GOAL:   Patient will meet greater than or equal to 90% of their needs -progressing   MONITOR:   PO intake, Supplement acceptance, Labs, Weight trends, Skin, I & O's  ASSESSMENT:   63 y/o male with h/o COPD admitted with left-sided empyema.   Pt continues to have poor appetite and oral intake. Pt is drinking the Ensure supplements. Pt is on Remeron. RD will continue to monitor patient's oral intake. Per chart, pt with weight gain since admit.   Medications reviewed and include: remeron, MVI, LRS @75ml /hr  Labs reviewed: Na 134(L)- 10/26 Wbc- Hgb 9.0(L), Hct 28.3(L)- 10/26  Diet Order:   Diet Order            Diet NPO time specified  Diet effective midnight             EDUCATION NEEDS:   Education needs have been addressed  Skin:  Skin Assessment: Reviewed RN Assessment(incision abdomen)  Last BM:  10/26- type 5  Height:   Ht Readings from Last 1 Encounters:  06/10/19 5\' 11"  (1.803 m)    Weight:   Wt Readings from Last 1 Encounters:  06/14/19 46.6 kg    Ideal Body Weight:  78.1 kg  BMI:  Body mass index is 14.32 kg/m.  Estimated Nutritional Needs:   Kcal:  1700-1900kcal/day  Protein:  85-95g/day  Fluid:  >1.4L/day  Koleen Distance MS, RD, LDN Pager #- [phone] Office#- [phone] After Hours Pager: [phone]

## 2019-06-14 NOTE — H&P (Signed)
Patient ID: Tom Mcclure, male   DOB: February 18, 1956, 63 y.o.   MRN: [id number]  No chief complaint on file.   HPI Tom Mcclure is a 63 y.o. male.  He presented to our facility approximately 1 month ago.  He was discharged from the [facility] after undergoing a thoracoscopy for a left-sided empyema.  When he was discharged from that facility he was told to follow-up with his primary care doctor but because he is uninsured he just came to the emergency room instead.  I saw the patient at that time.  He had a mushroom catheter placed in his left hemithorax and we had converted that to open drainage.  He comes into the clinic today stating that he is not much better.  He fell and hurt his back and this is limited his mobility.  He continues to smoke approximately 6 cigarettes/day.  He is short of breath at rest.  There is some purulent drainage from the tube and around the tube.  We did get a chest x-ray today and that shows a left hydropneumothorax which is essentially unchanged from before.  Because the patient is uninsured he was unable to meet with the pulmonologist or with the pain management specialist.  We will therefore need to address those issues.  I did review with the daughter and she states that he has a very poor appetite.  He is only able to eat about a half a doughnut per day.  He does drink 3 Ensure or boost per day.  Otherwise he does not eat any solid foods.  He states he is just not hungry.  He has a chronic cough which she has had for 40 years he states.  This is essentially unchanged.   Past Medical History:  Diagnosis Date  . COPD (chronic obstructive pulmonary disease) (HCC)   . Depression   . GERD (gastroesophageal reflux disease)   . Hypertension     Past Surgical History:  Procedure Laterality Date  . CHEST TUBE INSERTION     left chest  . TRACHEOSTOMY      Family History  Problem Relation Age of Onset  . Leukemia Mother   . Alcohol abuse  Father     Social History Social History   Tobacco Use  . Smoking status: Current Every Day Smoker    Packs/day: 0.25    Types: Cigarettes  . Smokeless tobacco: Never Used  Substance Use Topics  . Alcohol use: Yes    Comment: "too much"  . Drug use: Never    Allergies  Allergen Reactions  . Morphine And Related Hives  . Penicillins Hives    Current Facility-Administered Medications  Medication Dose Route Frequency Provider Last Rate Last Dose  . [MAR Hold] 0.9 %  sodium chloride infusion (Manually program via Guardrails IV Fluids)   Intravenous Once Hulda Marin, MD      . Mitzi Hansen Hold] 0.9 %  sodium chloride infusion   Intravenous PRN Vida Rigger, MD   Stopped at 06/13/19 2105  . [MAR Hold] Chlorhexidine Gluconate Cloth 2 % PADS 6 each  6 each Topical Q0600 Hulda Marin, MD   6 each at 06/14/19 [phone]  . [MAR Hold] diphenhydrAMINE (BENADRYL) capsule 25 mg  25 mg Oral TID Vida Rigger, MD   25 mg at 06/13/19 0905  . [MAR Hold] feeding supplement (ENSURE ENLIVE) (ENSURE ENLIVE) liquid 237 mL  237 mL Oral TID BM Hulda Marin, MD   237 mL at 06/13/19  2106  . [MAR Hold] HYDROcodone-acetaminophen (NORCO/VICODIN) 5-325 MG per tablet 1 tablet  1 tablet Oral Q4H PRN Herbert Pun, MD   1 tablet at 06/14/19 0854  . lactated ringers infusion   Intravenous Continuous Durenda Hurt, MD 75 mL/hr at 06/14/19 1133    . [MAR Hold] levofloxacin (LEVAQUIN) IVPB 750 mg  750 mg Intravenous Q24H Ottie Glazier, MD   Stopped at 06/13/19 1900  . [MAR Hold] mirtazapine (REMERON) tablet 7.5 mg  7.5 mg Oral QHS Nestor Lewandowsky, MD   7.5 mg at 06/13/19 2111  . [MAR Hold] multivitamin with minerals tablet 1 tablet  1 tablet Oral Daily Nestor Lewandowsky, MD   1 tablet at 06/14/19 [phone]  . [MAR Hold] mupirocin ointment (BACTROBAN) 2 % 1 application  1 application Nasal BID Nestor Lewandowsky, MD   1 application at 09/47/09 [phone]  . [MAR Hold] QUEtiapine (SEROQUEL) tablet 50 mg  50 mg Oral QHS Nestor Lewandowsky, MD   50 mg at 06/13/19 2111  . [MAR Hold] sertraline (ZOLOFT) tablet 50 mg  50 mg Oral Daily Nestor Lewandowsky, MD   50 mg at 06/14/19 0853  . sodium chloride flush 0.9 % injection             Location, Quality, Duration, Severity, Timing, Context, Modifying Factors, Associated Signs and Symptoms.  Review of Systems A complete review of systems was asked and was negative except for the following positive findings shortness of breath, failure to thrive  Blood pressure 94/64, pulse 75, temperature (!) 95.8 F (35.4 C), temperature source Temporal, resp. rate 19, height 5\' 11"  (1.803 m), weight 46.6 kg, SpO2 100 %.  Physical Exam CONSTITUTIONAL:  Pleasant, well-developed, cachectic, and in no acute distress. EYES: Pupils equal and reactive to light, Sclera non-icteric EARS, NOSE, MOUTH AND THROAT:  The oropharynx was clear.  Dentition is poor repair.  Oral mucosa pink and moist. LYMPH NODES:  Lymph nodes in the neck and axillae were normal RESPIRATORY:  Lungs were very distant.  Normal respiratory effort without pathologic use of accessory muscles of respiration CARDIOVASCULAR: Heart was regular without murmurs.  There were no carotid bruits. GI: The abdomen was soft, nontender, and nondistended. There were no palpable masses. There was no hepatosplenomegaly. There were normal bowel sounds in all quadrants. GU:   MUSCULOSKELETAL:  Normal muscle strength and tone.  No clubbing or cyanosis.   SKIN:  There were no pathologic skin lesions.  There were no nodules on palpation. NEUROLOGIC:  Sensation is normal.  Cranial nerves are grossly intact. PSYCH:  Oriented to person, place and time.  Mood and affect are normal.  I did resecure his drain.  There is pus draining from around the gastrostomy tube.  There is a large air lea  Data Reviewed Chest x-ray  I have personally reviewed the patient's imaging and medical records.    Assessment    Empyema    Plan    We will admit the  patient to the hospital.  He needs an extensive evaluation by multiple specialists and he will ultimately require surgical debridement.  I think it would be best if he comes in the hospital today for further management.

## 2019-06-14 NOTE — Anesthesia Postprocedure Evaluation (Signed)
Anesthesia Post Note  Patient: Tom Mcclure  Procedure(s) Performed: [name] PROCEDURE (Left )  Patient location during evaluation: PACU Anesthesia Type: General Level of consciousness: awake and alert Pain management: pain level controlled Vital Signs Assessment: post-procedure vital signs reviewed and stable Respiratory status: spontaneous breathing, nonlabored ventilation, respiratory function stable and patient connected to nasal cannula oxygen Cardiovascular status: blood pressure returned to baseline and stable Postop Assessment: no apparent nausea or vomiting Anesthetic complications: no     Last Vitals:  Vitals:   06/14/19 1825 06/14/19 1840  BP: 111/71 108/60  Pulse: 81 78  Resp: 15 17  Temp:    SpO2: 98% 100%    Last Pain:  Vitals:   06/14/19 1825  TempSrc:   PainSc: [name]

## 2019-06-14 NOTE — Progress Notes (Signed)
PT Cancellation Note  Patient Details Name: KORDEL LEAVY MRN: [id number] DOB: 04-Jul-1956   Cancelled Treatment:    Reason Eval/Treat Not Completed: Other (comment): Per nursing pt scheduled for surgery later this morning.  Will attempt to see pt at a future date/time as medically appropriate pending receipt of new or continue at transfer PT orders.     Linus Salmons PT, DPT 06/14/19, 9:40 AM

## 2019-06-14 NOTE — Anesthesia Procedure Notes (Signed)
Procedure Name: Intubation Performed by: Fletcher-Harrison, Akiko Schexnider, CRNA Pre-anesthesia Checklist: Patient identified, Emergency Drugs available, Suction available and Patient being monitored Patient Re-evaluated:Patient Re-evaluated prior to induction Oxygen Delivery Method: Circle system utilized Preoxygenation: Pre-oxygenation with 100% oxygen Induction Type: IV induction Ventilation: Mask ventilation without difficulty Laryngoscope Size: McGraph and 3 Grade View: Grade I Tube type: Oral Tube size: 6.5 mm Number of attempts: 1 Airway Equipment and Method: Stylet Placement Confirmation: ETT inserted through vocal cords under direct vision,  positive ETCO2,  CO2 detector and breath sounds checked- equal and bilateral Secured at: 21 cm Tube secured with: Tape Dental Injury: Teeth and Oropharynx as per pre-operative assessment        

## 2019-06-14 NOTE — TOC Progression Note (Signed)
Transition of Care [facility]) - Progression Note    Patient Details  Name: DAYLE MCNERNEY MRN: [id number] Date of Birth: 1955/10/12  [facility] of [facility] CM/SW Contact  Beverly Sessions, RN Phone Number: 06/14/2019, 4:25 PM  Clinical Narrative:     RNCM attempted to assess patient.  Patient off the floor  Reported that patient is uninsured, and is living with locally with daughter.   Per MD chest tube will be removed.  Patient will require charity home health for nursing wound care. Per MD patient will discharge with dressing changes, and likely have a wound vac placed outpatient        Expected Discharge Plan and Services                                                 Social Determinants of Health (SDOH) Interventions    Readmission Risk Interventions No flowsheet data found.

## 2019-06-15 ENCOUNTER — Inpatient Hospital Stay: Payer: Medicaid Other

## 2019-06-15 DIAGNOSIS — J9621 Acute and chronic respiratory failure with hypoxia: Secondary | ICD-10-CM

## 2019-06-15 LAB — BASIC METABOLIC PANEL
Anion gap: 12 (ref 5–15)
BUN: 27 mg/dL — ABNORMAL HIGH (ref 8–23)
CO2: 27 mmol/L (ref 22–32)
Calcium: 8.9 mg/dL (ref 8.9–10.3)
Chloride: 97 mmol/L — ABNORMAL LOW (ref 98–111)
Creatinine, Ser: 0.98 mg/dL (ref 0.61–1.24)
GFR calc Af Amer: >60 mL/min (ref >60)
GFR calc non Af Amer: >60 mL/min (ref >60)
Glucose, Bld: 130 mg/dL — ABNORMAL HIGH (ref 70–99)
Potassium: 4.3 mmol/L (ref 3.5–5.1)
Sodium: 136 mmol/L (ref 135–145)

## 2019-06-15 LAB — TYPE AND SCREEN
ABO/RH(D): O POS
Antibody Screen: NEGATIVE
Unit division: 0
Unit division: 0

## 2019-06-15 LAB — CBC
HCT: 24.5 % — ABNORMAL LOW (ref 39.0–52.0)
Hemoglobin: 7.6 g/dL — ABNORMAL LOW (ref 13.0–17.0)
MCH: 28.5 pg (ref 26.0–34.0)
MCHC: 31 g/dL (ref 30.0–36.0)
MCV: 91.8 fL (ref 80.0–100.0)
Platelets: 448 10*3/uL — ABNORMAL HIGH (ref 150–400)
RBC: 2.67 MIL/uL — ABNORMAL LOW (ref 4.22–5.81)
RDW: 13.6 % (ref 11.5–15.5)
WBC: 14 10*3/uL — ABNORMAL HIGH (ref 4.0–10.5)
nRBC: 0 % (ref 0.0–0.2)

## 2019-06-15 LAB — PREPARE RBC (CROSSMATCH)

## 2019-06-15 LAB — BPAM RBC
Blood Product Expiration Date: 202011262359
Blood Product Expiration Date: 202011262359
ISSUE DATE / TIME: 202010271253
ISSUE DATE / TIME: 202010271253
Unit Type and Rh: 5100
Unit Type and Rh: 5100

## 2019-06-15 LAB — BODY FLUID CULTURE

## 2019-06-15 LAB — ACID FAST SMEAR (AFB, MYCOBACTERIA): Acid Fast Smear: NEGATIVE

## 2019-06-15 MED ORDER — HYDROCODONE-ACETAMINOPHEN 5-325 MG PO TABS
ORAL_TABLET | ORAL | Status: AC
  Administered 2019-06-15: 03:00:00 1 via ORAL
  Filled 2019-06-15: qty 1

## 2019-06-15 MED ORDER — HYDROCODONE-ACETAMINOPHEN 5-325 MG PO TABS
ORAL_TABLET | ORAL | Status: AC
  Administered 2019-06-15: 13:00:00
  Filled 2019-06-15: qty 1

## 2019-06-15 MED ORDER — FENTANYL CITRATE (PF) 100 MCG/2ML IJ SOLN
INTRAMUSCULAR | Status: AC
  Administered 2019-06-15: 50 ug via INTRAVENOUS
  Filled 2019-06-15: qty 2

## 2019-06-15 MED ORDER — IPRATROPIUM BROMIDE 0.02 % IN SOLN
0.5000 mg | Freq: Two times a day (BID) | RESPIRATORY_TRACT | Status: DC
  Administered 2019-06-15 – 2019-06-17 (×5): 0.5 mg via RESPIRATORY_TRACT
  Filled 2019-06-15 (×5): qty 2.5

## 2019-06-15 MED ORDER — HYDROCODONE-ACETAMINOPHEN 5-325 MG PO TABS
1.0000 | ORAL_TABLET | ORAL | Status: DC | PRN
  Administered 2019-06-15: 03:00:00 1 via ORAL

## 2019-06-15 MED ORDER — HYDROCODONE-ACETAMINOPHEN 5-325 MG PO TABS
1.0000 | ORAL_TABLET | ORAL | Status: DC | PRN
  Administered 2019-06-15: 2 via ORAL
  Administered 2019-06-15: 07:00:00 1 via ORAL
  Administered 2019-06-15 – 2019-06-19 (×9): 2 via ORAL
  Filled 2019-06-15 (×9): qty 2

## 2019-06-15 MED ORDER — LEVALBUTEROL HCL 0.63 MG/3ML IN NEBU
0.6300 mg | INHALATION_SOLUTION | RESPIRATORY_TRACT | Status: DC | PRN
  Filled 2019-06-15: qty 3

## 2019-06-15 MED ORDER — LEVOFLOXACIN IN D5W 750 MG/150ML IV SOLN
750.0000 mg | INTRAVENOUS | Status: DC
  Administered 2019-06-15: 750 mg via INTRAVENOUS
  Filled 2019-06-15 (×2): qty 150

## 2019-06-15 MED ORDER — HYDROCODONE-ACETAMINOPHEN 5-325 MG PO TABS
ORAL_TABLET | ORAL | Status: AC
  Administered 2019-06-15: 13:00:00 2 via ORAL
  Filled 2019-06-15: qty 2

## 2019-06-15 MED ORDER — HYDROMORPHONE HCL 1 MG/ML IJ SOLN
INTRAMUSCULAR | Status: AC
  Filled 2019-06-15: qty 1
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
## 2019-06-15 NOTE — Progress Notes (Signed)
PT Cancellation Note  Patient Details Name: SIAH STEELY MRN: [id number] DOB: 07-10-1956   Cancelled Treatment:    Reason Eval/Treat Not Completed: Other (comment): PT to continue to follow patient and attempt PT evaluation once pt is transferred from the PACU to the ICU.   Linus Salmons PT, DPT 06/15/19, 1:05 PM

## 2019-06-15 NOTE — Progress Notes (Signed)
Did will overnight.  Not short of breath today but does have some discomfort  BP 100/60 range.  Oxygen sats good  I did change his external dressing this morning.  There was some gray discharge on the dressings  His chest xray looks about the same  Will transfer to floor today.  Leave internal packing in place today.  Change tomorrow with the daughter present.  [employer].

## 2019-06-15 NOTE — Progress Notes (Signed)
06/15/2019 5:15 PM  Attempted to get patient to ambulate.  Explained rationale for ambulating and risks of staying in bed as they relate to his condition and overall health/.  Patient insists he is too worn out too walk now.  Will notify night shift and continue to urge patient to ambulate as it is essential for a better recovery. Dola Argyle, RN

## 2019-06-15 NOTE — Progress Notes (Signed)
PT Cancellation Note  Patient Details Name: Tom Mcclure MRN: [id number] DOB: February 14, 1956   Cancelled Treatment:    Reason Eval/Treat Not Completed: Patient declined PT evaluation secondary to fatigue.  Encouraged patient to attempt mobility to his tolerance such as getting sitting up to EOB with patient again declining.  Pt stated that he would participate with PT tomorrow.  Will attempt to see patient tomorrow as medically appropriate.    Linus Salmons PT, DPT 06/15/19, 3:07 PM

## 2019-06-15 NOTE — Consult Note (Signed)
Name: Tom Mcclure MRN: [id number] DOB: 02-Mar-1956    ADMISSION DATE:  06/10/2019 CONSULTATION DATE:  06/14/2019  REFERRING MD :  Dr. Thelma Barge  CHIEF COMPLAINT: Shortness of breath  BRIEF PATIENT DESCRIPTION:  63 year old male admitted with chronic empyema of the left chest.  Required chest wall resection with open drainage and packing of the empyema cavity.  To transfer to ICU post procedure.  SIGNIFICANT EVENTS  10/23-admission to MedSurg unit 10/23-pulmonary consultation 10/26-hospitalist consulted 10/27-underwent chest wall resection with open packing of the empyema cavity 10/27-to transfer to ICU postop, however remains in PACU due to bed shortage in ICU; PCCM consulted  STUDIES:  N/A  CULTURES: SARS-CoV-2 PCR 10/23>> negative MRSA PCR 10/27>> positive Body fluid culture of surgical wound 10/27>> AFB 10/27>> Fungus culture 10/27>>  ANTIBIOTICS: Levofloxacin 10/24>>  HISTORY OF PRESENT ILLNESS:   Tom Mcclure is a 63 year old male with a past medical history notable for COPD, current smoker, hypertension, GERD, depression, and chronic left-sided empyema   of which he was treated at the [facility] where he underwent thoracoscopy and had gastrostomy tube placed in pleural space to drain the empyema.  He saw Dr. Thelma Barge in clinic on 06/10/2019 due to limited mobility, dyspnea on exertion, and purulent drainage from his chest tube.  Chest X was obtained and showed left hydropneumothorax (which was unchanged from previous).  He was admitted directly to MedSurg unit by Dr. Thelma Barge for eventual surgical debridement of the empyema.  On 06/14/2019 he underwent left chest wall resection with open drainage and packing of the empyema cavity.  He is to be admitted to ICU post procedure.  He is currently in PACU due to bed unavailability in ICU due to staffing.  PCCM is consulted for further management.  PAST MEDICAL HISTORY :   has a past medical history of COPD (chronic  obstructive pulmonary disease) (HCC), Depression, GERD (gastroesophageal reflux disease), and Hypertension.  has a past surgical history that includes Chest tube insertion and Tracheostomy. Prior to Admission medications   Medication Sig Start Date End Date Taking? Authorizing Provider  folic acid (FOLVITE) 1 MG tablet Take 1 mg by mouth daily.   Yes [provider]  ipratropium (ATROVENT HFA) 17 MCG/ACT inhaler Inhale 1 puff into the lungs 2 (two) times daily.    Yes [provider]  levalbuterol (XOPENEX HFA) 45 MCG/ACT inhaler Inhale 1-2 puffs into the lungs every 4 (four) hours as needed for wheezing.   Yes [provider]  metoprolol tartrate (LOPRESSOR) 25 MG tablet Take 1 tablet (25 mg total) by mouth 2 (two) times daily. 05/11/19  Yes Enid Baas, MD  mirtazapine (REMERON) 15 MG tablet Take 0.5 tablets (7.5 mg total) by mouth at bedtime. 05/11/19  Yes Enid Baas, MD  QUEtiapine (SEROQUEL) 50 MG tablet Take 1 tablet (50 mg total) by mouth at bedtime. 05/11/19  Yes Enid Baas, MD  sertraline (ZOLOFT) 50 MG tablet Take 1 tablet (50 mg total) by mouth daily. 05/11/19  Yes Enid Baas, MD  vitamin C (ASCORBIC ACID) 500 MG tablet Take 500 mg by mouth daily.   Yes [provider]   Allergies  Allergen Reactions  . Morphine And Related Hives  . Penicillins Hives    FAMILY HISTORY:  family history includes Alcohol abuse in his father; Leukemia in his mother. SOCIAL HISTORY:  reports that he has been smoking cigarettes. He has been smoking about 0.25 packs per day. He has never used smokeless tobacco. He reports current  alcohol use. He reports that he does not use drugs.   COVID-19 DISASTER DECLARATION:  FULL CONTACT PHYSICAL EXAMINATION WAS NOT POSSIBLE DUE TO TREATMENT OF COVID-19 AND  CONSERVATION OF PERSONAL PROTECTIVE EQUIPMENT, LIMITED EXAM FINDINGS INCLUDE-  Patient assessed or the symptoms described in the history of  present illness.  In the context of the Global COVID-19 pandemic, which necessitated consideration that the patient might be at risk for infection with the SARS-CoV-2 virus that causes COVID-19, Institutional protocols and algorithms that pertain to the evaluation of patients at risk for COVID-19 are in a state of rapid change based on information released by regulatory bodies including the CDC and federal and state organizations. These policies and algorithms were followed during the patient's care while in hospital.  REVIEW OF SYSTEMS: Positives in bold Constitutional: Negative for fever, chills, weight loss, malaise/fatigue and diaphoresis.  HENT: Negative for hearing loss, ear pain, nosebleeds, congestion, sore throat, neck pain, tinnitus and ear discharge.   Eyes: Negative for blurred vision, double vision, photophobia, pain, discharge and redness.  Respiratory: Negative for cough, hemoptysis, sputum production, shortness of breath on exertion, wheezing and stridor.   Cardiovascular: Negative for chest pain, palpitations, orthopnea, claudication, leg swelling and PND.  Gastrointestinal: Negative for heartburn, nausea, vomiting, abdominal pain, diarrhea, constipation, blood in stool and melena.  Genitourinary: Negative for dysuria, urgency, frequency, hematuria and flank pain.  Musculoskeletal: Negative for myalgias, back pain, joint pain and falls.  Skin: Negative for itching and rash.  Neurological: Negative for dizziness, tingling, tremors, sensory change, speech change, focal weakness, seizures, loss of consciousness, weakness and headaches.  Endo/Heme/Allergies: Negative for environmental allergies and polydipsia. Does not bruise/bleed easily.  SUBJECTIVE:  Patient reports shortness of breath with exertion Reports pain is currently well controlled Denies chest pain, fever/chills, wheezing, edema On 2 L nasal cannula Wanting to drink an Ensure  VITAL SIGNS: Temp:  [95.8 F (35.4  C)-98.4 F (36.9 C)] 98.1 F (36.7 C) (10/27 2210) Pulse Rate:  [75-87] 78 (10/27 2210) Resp:  [11-28] 14 (10/27 2210) BP: (94-118)/(51-74) 94/62 (10/27 2210) SpO2:  [90 %-100 %] 100 % (10/27 2312) Weight:  [46.6 kg] 46.6 kg (10/27 0500)  PHYSICAL EXAMINATION: General: Acute on chronically ill-appearing male, sitting in bed, on 2 L nasal cannula, no acute distress Neuro: Awake, alert and oriented x3, follows commands, no focal deficits, speech clear, pupils PERRLA HEENT: Atraumatic, normocephalic, neck supple, no JVD Cardiovascular: Regular rate and rhythm, S1-S2, no murmurs rubs or gallops, 2+ pulses Lungs: Clear to auscultation bilaterally, no wheezing, even, nonlabored, normal effort Abdomen: Soft, nondistended, nontender, no guarding or rebound tenderness, bowel sounds positive x4 Musculoskeletal: Normal bulk and tone, no deformities, no edema Skin: Warm and dry, no obvious rashes lesions or ulcerations.  Left chest surgical site dressing clean dry and intact.  Recent Labs  Lab 06/10/19 1428 06/13/19 0530 06/14/19 1623  NA 134* 134* 135  K 4.2 4.1 4.4  CL 93* 95* 97*  CO2 24 27 28   BUN 21 31* 29*  CREATININE 0.83 0.85 1.00  GLUCOSE 99 96 137*   Recent Labs  Lab 06/10/19 1428 06/13/19 0530 06/14/19 1623  HGB 10.1* 9.0* 9.2*  HCT 31.3* 28.3* 29.5*  WBC 16.2* 9.7 16.7*  PLT 294 471* 475*   Dg Chest Port 1 View  Result Date: 06/14/2019 CLINICAL DATA:  Post Eloesser flap surgery for chronic left empyema. EXAM: PORTABLE CHEST 1 VIEW COMPARISON:  Chest x-ray from yesterday. FINDINGS: The heart size and mediastinal contours are  within normal limits. Normal pulmonary vascularity. Interval removal of the left-sided chest tube and the left lower lateral seventh through ninth ribs. Chronic empyema cavity on the left again noted containing air and now packing material likely accounting for the increased density overlying the left mid to lower lung. The lungs remain hyperinflated  with emphysematous changes. Unchanged calcified granuloma in the right middle lobe. No acute osseous abnormality. IMPRESSION: Postsurgical changes on the left with new packing material within the chronic empyema cavity. Electronically Signed   By: Obie DredgeWilliam T Derry M.D.   On: 06/14/2019 16:22   Dg Chest Port 1 View  Result Date: 06/13/2019 CLINICAL DATA:  Postop chest wall mass excision. EXAM: PORTABLE CHEST 1 VIEW COMPARISON:  06/10/2019 FINDINGS: Lungs are adequately inflated with postoperative changes of the left lung. Chest tube over the left lung base remains in place. Less pleural fluid over the left base with small persistent pneumothorax component laterally over the left lateral thorax. Cardiomediastinal silhouette and remainder of the exam is unchanged. IMPRESSION: Left basilar chest tube remains in place with persistent small left pneumothorax and improved small amount of left pleural fluid. Electronically Signed   By: Elberta Fortisaniel  Boyle M.D.   On: 06/13/2019 14:47    ASSESSMENT / PLAN:  Acute on chronic hypoxic respiratory failure secondary to chronic empyema of the left chest Hx: COPD, smoker -Supplemental O2 as needed to maintain O2 sats 88 to 94% -Follow intermittent chest x-ray and ABG as needed -Cardiothoracic surgery following, appreciate input -Status post chest wall resection with open drainage and packing of empyema cavity on 06/14/2019 -Previous cultures (05/09/19) grew Pseudomonas -Continue Levofloxacin -Infectious disease consulted, appreciate input -Postop care as per cardiothoracic surgery -Pain control -Incentive spirometry -Encourage ambulation when approved by surgery -Bronchodilators -Encourage smoking cessation  Anemia without signs and symptoms of bleeding -Monitor for S/Sx of bleeding -Trend CBC -SCDs for VTE Prophylaxis; can resume chemical anticoagulation when approved by surgery -Transfuse for Hgb <7  GERD -Place on Protonix  Hx of Depression -Continue  Seroquel and Remeron        Disposition: ICU Goals of care: Full code VTE prophylaxis: SCDs Updates: Updated patient at bedside 06/14/2019  Harlon DittyJeremiah Tymel Conely, Rmc Surgery Center IncGACNP-BC [facility] Pager: [phone]  06/15/2019, 12:12 AM

## 2019-06-16 ENCOUNTER — Encounter: Payer: Self-pay | Admitting: Cardiothoracic Surgery

## 2019-06-16 DIAGNOSIS — D72829 Elevated white blood cell count, unspecified: Secondary | ICD-10-CM

## 2019-06-16 DIAGNOSIS — Z8614 Personal history of Methicillin resistant Staphylococcus aureus infection: Secondary | ICD-10-CM

## 2019-06-16 DIAGNOSIS — Z8701 Personal history of pneumonia (recurrent): Secondary | ICD-10-CM

## 2019-06-16 DIAGNOSIS — F1010 Alcohol abuse, uncomplicated: Secondary | ICD-10-CM

## 2019-06-16 DIAGNOSIS — Z880 Allergy status to penicillin: Secondary | ICD-10-CM

## 2019-06-16 DIAGNOSIS — Z590 Homelessness: Secondary | ICD-10-CM

## 2019-06-16 DIAGNOSIS — Z79899 Other long term (current) drug therapy: Secondary | ICD-10-CM

## 2019-06-16 DIAGNOSIS — Z885 Allergy status to narcotic agent status: Secondary | ICD-10-CM

## 2019-06-16 DIAGNOSIS — Z87828 Personal history of other (healed) physical injury and trauma: Secondary | ICD-10-CM

## 2019-06-16 DIAGNOSIS — B9561 Methicillin susceptible Staphylococcus aureus infection as the cause of diseases classified elsewhere: Secondary | ICD-10-CM

## 2019-06-16 DIAGNOSIS — B965 Pseudomonas (aeruginosa) (mallei) (pseudomallei) as the cause of diseases classified elsewhere: Secondary | ICD-10-CM

## 2019-06-16 DIAGNOSIS — Z978 Presence of other specified devices: Secondary | ICD-10-CM

## 2019-06-16 DIAGNOSIS — D638 Anemia in other chronic diseases classified elsewhere: Secondary | ICD-10-CM

## 2019-06-16 DIAGNOSIS — J449 Chronic obstructive pulmonary disease, unspecified: Secondary | ICD-10-CM

## 2019-06-16 DIAGNOSIS — Z8619 Personal history of other infectious and parasitic diseases: Secondary | ICD-10-CM

## 2019-06-16 DIAGNOSIS — Z9889 Other specified postprocedural states: Secondary | ICD-10-CM

## 2019-06-16 DIAGNOSIS — J869 Pyothorax without fistula: Principal | ICD-10-CM

## 2019-06-16 DIAGNOSIS — F17210 Nicotine dependence, cigarettes, uncomplicated: Secondary | ICD-10-CM

## 2019-06-16 LAB — HEPATITIS C ANTIBODY: HCV Ab: REACTIVE — AB

## 2019-06-16 LAB — SURGICAL PATHOLOGY

## 2019-06-16 LAB — FUNGITELL, SERUM: Fungitell Result: <31 pg/mL (ref <80)

## 2019-06-16 LAB — HIV ANTIBODY (ROUTINE TESTING W REFLEX): HIV Screen 4th Generation wRfx: NONREACTIVE

## 2019-06-16 MED ORDER — LEVOFLOXACIN 750 MG PO TABS
750.0000 mg | ORAL_TABLET | Freq: Every day | ORAL | Status: DC
  Administered 2019-06-16: 750 mg via ORAL
  Filled 2019-06-16 (×2): qty 1

## 2019-06-16 MED ORDER — SULFAMETHOXAZOLE-TRIMETHOPRIM 800-160 MG PO TABS
1.0000 | ORAL_TABLET | Freq: Two times a day (BID) | ORAL | Status: DC
  Administered 2019-06-16 – 2019-06-19 (×6): 1 via ORAL
  Filled 2019-06-16 (×7): qty 1

## 2019-06-16 NOTE — Progress Notes (Signed)
PHARMACIST - PHYSICIAN COMMUNICATION  DR:   Genevive Bi  CONCERNING: Antibiotic IV to Oral Route Change Policy  RECOMMENDATION: This patient is receiving Levaquin by the intravenous route.  Based on criteria approved by the Pharmacy and Therapeutics Committee, the antibiotic(s) is/are being converted to the equivalent oral dose form(s).   DESCRIPTION: These criteria include:  Patient being treated for a respiratory tract infection, urinary tract infection, cellulitis or clostridium difficile associated diarrhea if on metronidazole  The patient is not neutropenic and does not exhibit a GI malabsorption state  The patient is eating (either orally or via tube) and/or has been taking other orally administered medications for a least 24 hours  The patient is improving clinically and has a Tmax < 100.5  If you have questions about this conversion, please contact the [place], PharmD, BCPS Clinical Pharmacist 06/16/2019 10:30 AM

## 2019-06-16 NOTE — Progress Notes (Signed)
Patient provided letter of support from wife. Still needs to provide wife's income before eligibility can be determined.  No additional medication assistance will be provided until we receive this information.  [facility]
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
## 2019-06-16 NOTE — TOC Initial Note (Signed)
Transition of Care [facility]) - Initial/Assessment Note    Patient Details  Name: Tom Mcclure MRN: [id number] Date of Birth: [birth year]  Transition of Care [facility]) CM/SW Contact:    Beverly Sessions, RN Phone Number: 06/16/2019, 4:34 PM  Clinical Narrative:                 Patient admitted from home with Empyema.   Patient lives at home with his daughter, son in law, and grandchildren.   Patient moved to [place] from [place] 2 months ago.  No PCP Relies on daughter for transportation. Patient states he does not have a pharmacy.  Previous discharge obtained medication from Medication Management   PT has seen patient and recommends home health PT and BSC.  Patient will need home health for wound care.  Patient initially reluctant for home health services.  Then after discussing with his daughter he is in agreement. Referral made to [facility] with [place] as they are on rotation for charity home health services this week.  Daughter to be educated on dressing changes.  Provided application to Medication Management, [facility] , "The Network:  Your Guide to [employer] and [employer] in [facility]"  Booklet.   New patient appointment made for [facility] 11/12  Expected Discharge Plan: [place] Barriers to Discharge: Continued Medical Work up   Patient Goals and CMS Choice     Choice offered to / list presented to : Patient  Expected Discharge Plan and Services Expected Discharge Plan: [place]   Discharge Planning Services: CM Consult Post Acute Care Choice: [place] arrangements for the past 2 months: Single Family Home                           HH Arranged: RN, PT          Prior Living Arrangements/Services Living arrangements for the past 2 months: Single Family Home Lives with:: Adult Children, Relatives Patient language and need for interpreter reviewed:: Yes Do you feel safe  going back to the place where you live?: Yes      Need for Family Participation in Patient Care: Yes (Comment) Care giver support system in place?: Yes (comment)   Criminal Activity/Legal Involvement Pertinent to Current Situation/Hospitalization: No - Comment as needed  Activities of Daily Living Home Assistive Devices/Equipment: None ADL Screening (condition at time of admission) Patient's cognitive ability adequate to safely complete daily activities?: Yes Is the patient deaf or have difficulty hearing?: No Does the patient have difficulty seeing, even when wearing glasses/contacts?: No Does the patient have difficulty concentrating, remembering, or making decisions?: No Patient able to express need for assistance with ADLs?: Yes Does the patient have difficulty dressing or bathing?: Yes Independently performs ADLs?: No Communication: Independent Dressing (OT): Needs assistance Is this a change from baseline?: Pre-admission baseline Grooming: Needs assistance Is this a change from baseline?: Pre-admission baseline Feeding: Independent Bathing: Needs assistance Is this a change from baseline?: Pre-admission baseline Toileting: Needs assistance Is this a change from baseline?: Pre-admission baseline In/Out Bed: Needs assistance Is this a change from baseline?: Pre-admission baseline Walks in Home: Needs assistance Is this a change from baseline?: Pre-admission baseline Does the patient have difficulty walking or climbing stairs?: Yes Weakness of Legs: Both Weakness of Arms/Hands: Both  Permission Sought/Granted  Emotional Assessment Appearance:: Appears older than stated age Attitude/Demeanor/Rapport: Engaged Affect (typically observed): Calm, Accepting Orientation: : Oriented to Self, Oriented to  Time, Oriented to Place, Oriented to Situation Alcohol / Substance Use: Tobacco Use Psych Involvement: No (comment)  Admission diagnosis:   emypema Patient Active Problem List   Diagnosis Date Noted  . Protein-calorie malnutrition, severe 05/11/2019  . Empyema [facility])    PCP:  Patient, No Pcp Per Pharmacy:   [facility] [address] [place] - [address] [place] [address] Phone: [phone] Fax: [phone]  [facility] [address] - [place], [place] - [address] [place] [address] Phone: [phone] Fax: [phone]     Social Determinants of Health (SDOH) Interventions    Readmission Risk Interventions Readmission Risk Prevention Plan 06/16/2019  Post Dischage Appt Complete  Medication Screening Complete  Transportation Screening Complete

## 2019-06-16 NOTE — Evaluation (Signed)
Physical Therapy Evaluation Patient Details Name: Tom Mcclure MRN: [id number] DOB: September 13, 1955 Today's Date: 06/16/2019   History of Present Illness  Per MD: PT is a 63 y.o. male.  He presented to our facility approximately 1 month ago after undergoing a thoracoscopy for a left-sided empyema.  When he was discharged from that facility he was told to follow-up with his primary care doctor but because he is uninsured he just came to the emergency room instead. Chest x-ray  (10/23) shows a left hydropneumothorax which is essentially unchanged from before.  Because the patient is uninsured he was unable to meet with the pulmonologist or with the pain management specialist.  He drinks 3 Ensure or boost per day, otherwise he does not eat any solid foods.  He has a chronic cough for 40 years.  MD assessment includes: L empynema S/P Eloesser flap on 10/27, Hypertension, anemia of chronic disease, Euvolemic hyponatremia, Severe protein calorie malnutrition.  Clinical Impression  Pt presented with deficits in strength, transfers, mobility, gait, balance, and activity tolerance. Pt reported 0/10 pain in his L chest cavity at rest, and 3/10 with functional activity. Pt participated in a few bed exercises but insisted that he could walk and didn't see the point in continuing with graded exercises. Pt transferred and ambulated with CGA, but declined trying to exit the room, declined sitting up in recliner, and was returned to bed. Pt will benefit from HHPT services upon discharge to safely address above deficits for decreased caregiver assistance and eventual return to PLOF.      Follow Up Recommendations Home health PT    Equipment Recommendations  3in1 (PT)    Recommendations for Other Services       Precautions / Restrictions Precautions Precautions: Fall Precaution Comments: High fall risk Restrictions Weight Bearing Restrictions: No      Mobility  Bed Mobility Overal bed mobility:  Modified Independent             General bed mobility comments: Pt slowly, painfully, and with swearing got to EOB.  Transfers Overall transfer level: Needs assistance Equipment used: Rolling walker (2 wheeled) Transfers: Sit to/from Stand Sit to Stand: Min guard         General transfer comment: Pt required verbal and tactile cuing safely complete transfers.  Ambulation/Gait Ambulation/Gait assistance: Min guard Gait Distance (Feet): 12 Feet Assistive device: Rolling walker (2 wheeled) Gait Pattern/deviations: Step-through pattern;Decreased step length - right;Decreased step length - left Gait velocity: Decreased.   General Gait Details: Pt was impatient and pushed/shoved RW out in front, pt declined leaving the room, and insisted on sitting in bed rather than the chair.  Stairs            Wheelchair Mobility    Modified Rankin (Stroke Patients Only)       Balance Overall balance assessment: Needs assistance Sitting-balance support: Single extremity supported;Feet unsupported Sitting balance-Leahy Scale: Fair Sitting balance - Comments: Pt able to static sit, but was unsteady.   Standing balance support: Bilateral upper extremity supported Standing balance-Leahy Scale: Fair Standing balance comment: Pt kyphotic with mod lean into RW.                             Pertinent Vitals/Pain Pain Assessment: 0-10 Pain Score: 3  Pain Location: L chest cavity Pain Descriptors / Indicators: Jabbing Pain Intervention(s): Limited activity within patient's tolerance;Monitored during session    Home Living Family/patient expects to be discharged  to:: Private residence Living Arrangements: Children Available Help at Discharge: Available PRN/intermittently;Family Type of Home: House Home Access: Stairs to enter Entrance Stairs-Rails: Right Entrance Stairs-Number of Steps: 2 Home Layout: One level Home Equipment: Walker - 2 wheels      Prior Function  Level of Independence: Independent with assistive device(s)         Comments: Pt reported that he was Ind with ADL, does not drive, ambulates HH and community distances with RW, said he could "walk for miles" but he "can't breathe".     Hand Dominance   Dominant Hand: Right    Extremity/Trunk Assessment   Upper Extremity Assessment Upper Extremity Assessment: Generalized weakness    Lower Extremity Assessment Lower Extremity Assessment: Generalized weakness    Cervical / Trunk Assessment Cervical / Trunk Assessment: Kyphotic  Communication   Communication: No difficulties  Cognition Arousal/Alertness: Awake/alert Behavior During Therapy: Agitated Overall Cognitive Status: Within Functional Limits for tasks assessed                                 General Comments: Pt was somewhat confrontation, swearing frequently, initially resistant to therapy, and declined to sit in chair.      General Comments      Exercises Total Joint Exercises Ankle Circles/Pumps: AROM;Strengthening;Both;10 reps Heel Slides: AROM;Strengthening;Both;5 reps Hip ABduction/ADduction: AROM;Strengthening;Both;5 reps Long Arc Quad: AROM;Strengthening;Both;5 reps   Assessment/Plan    PT Assessment Patient needs continued PT services  PT Problem List Decreased strength;Decreased range of motion;Decreased activity tolerance;Decreased balance;Decreased mobility       PT Treatment Interventions DME instruction;Gait training;Stair training;Functional mobility training;Therapeutic activities;Therapeutic exercise;Patient/family education    PT Goals (Current goals can be found in the Care Plan section)  Acute Rehab PT Goals Patient Stated Goal: Get his appetite back and get strong again PT Goal Formulation: With patient Time For Goal Achievement: 06/30/19 Potential to Achieve Goals: Fair    Frequency Min 2X/week   Barriers to discharge        Co-evaluation                AM-PAC PT "6 Clicks" Mobility  Outcome Measure Help needed turning from your back to your side while in a flat bed without using bedrails?: None Help needed moving from lying on your back to sitting on the side of a flat bed without using bedrails?: A Little Help needed moving to and from a bed to a chair (including a wheelchair)?: A Little Help needed standing up from a chair using your arms (e.g., wheelchair or bedside chair)?: A Little Help needed to walk in hospital room?: A Little Help needed climbing 3-5 steps with a railing? : A Lot 6 Click Score: 18    End of Session Equipment Utilized During Treatment: Gait belt Activity Tolerance: Treatment limited secondary to agitation Patient left: in bed;with call bell/phone within reach Nurse Communication: Mobility status PT Visit Diagnosis: Unsteadiness on feet (R26.81);Muscle weakness (generalized) (M62.81);Difficulty in walking, not elsewhere classified (R26.2)    Time: 9811-9147 PT Time Calculation (min) (ACUTE ONLY): 18 min   Charges:              Juanda Crumble "Gus" Atsushi Yom, SPT  06/16/19, 1:33 PM

## 2019-06-16 NOTE — Progress Notes (Signed)
Pulmonary Medicine          Date: 06/16/2019,   MRN# [id number] Tom Mcclure 12-Feb-1956     AdmissionWeight: 45.4 kg                 CurrentWeight: 53.9 kg      CHIEF COMPLAINT:   Empyema   SUBJECTIVE    Pt is s/p downgrade from SDU post operatively. Has pain around surgical site. Discussed with surgery this am .  Unsure if Abx regimen is optimal for this empyema considering patients homelessness currently staying with daughter, patient states she will help him with wound/medical care.     PAST MEDICAL HISTORY   Past Medical History:  Diagnosis Date   COPD (chronic obstructive pulmonary disease) (HCC)    Depression    GERD (gastroesophageal reflux disease)    Hypertension      SURGICAL HISTORY   Past Surgical History:  Procedure Laterality Date   CHEST TUBE INSERTION     left chest   ELOESSOR Left 06/14/2019   Procedure: ELOESSOR PROCEDURE;  Surgeon: Hulda Marinaks, [name], MD;  Location: [facility] ORS;  Service: Thoracic;  Laterality: Left;   TRACHEOSTOMY       FAMILY HISTORY   Family History  Problem Relation Age of Onset   Leukemia Mother    Alcohol abuse Father      SOCIAL HISTORY   Social History   Tobacco Use   Smoking status: Current Every Day Smoker    Packs/day: 0.25    Types: Cigarettes   Smokeless tobacco: Never Used  Substance Use Topics   Alcohol use: Yes    Comment: "too much"   Drug use: Never     MEDICATIONS    Home Medication:    Current Medication:  Current Facility-Administered Medications:    bisacodyl (DULCOLAX) EC tablet 10 mg, 10 mg, Oral, Daily, Oaks, [name], MD, 10 mg at 06/15/19 1032   dextrose 5 %-0.45 % sodium chloride infusion, , Intravenous, Continuous, Hulda Marinaks, [name], MD, Last Rate: 75 mL/hr at 06/16/19 0138   fentaNYL (SUBLIMAZE) injection 25-50 mcg, 25-50 mcg, Intravenous, Q2H PRN, Hulda Marinaks, [name], MD, 50 mcg at 06/15/19 1231   folic acid (FOLVITE) tablet 1 mg, 1 mg, Oral, Daily,  Hulda Marinaks, [name], MD, 1 mg at 06/15/19 1031   HYDROcodone-acetaminophen (NORCO/VICODIN) 5-325 MG per tablet 1-2 tablet, 1-2 tablet, Oral, Q4H PRN, Hulda Marinaks, [name], MD, 2 tablet at 06/16/19 0555   ipratropium (ATROVENT) nebulizer solution 0.5 mg, 0.5 mg, Nebulization, BID, Oaks, Marcial Pacasimothy, MD, 0.5 mg at 06/16/19 0824   levalbuterol (XOPENEX) nebulizer solution 0.63 mg, 0.63 mg, Nebulization, Q4H PRN, Hulda Marinaks, [name], MD   levofloxacin (LEVAQUIN) IVPB 750 mg, 750 mg, Intravenous, Q24H, Oaks, Marcial Pacasimothy, MD, Last Rate: 100 mL/hr at 06/15/19 1841, 750 mg at 06/15/19 1841   mirtazapine (REMERON) tablet 7.5 mg, 7.5 mg, Oral, QHS, Oaks, [name], MD, 7.5 mg at 06/15/19 2305   QUEtiapine (SEROQUEL) tablet 50 mg, 50 mg, Oral, QHS, Oaks, Marcial Pacasimothy, MD, 50 mg at 06/15/19 2304   sertraline (ZOLOFT) tablet 50 mg, 50 mg, Oral, Daily, Oaks, [name], MD, 50 mg at 06/15/19 1032   vitamin C (ASCORBIC ACID) tablet 500 mg, 500 mg, Oral, Daily, Oaks, [name], MD, 500 mg at 06/15/19 1031    ALLERGIES   Morphine and related and Penicillins     REVIEW OF SYSTEMS    Review of Systems:  Gen:  Denies  fever, sweats, chills weigh loss  HEENT: Denies blurred vision, double vision, ear pain,  eye pain, hearing loss, nose bleeds, sore throat Cardiac:  No dizziness, chest pain or heaviness, chest tightness,edema Resp:   Denies cough or sputum porduction, shortness of breath,wheezing, hemoptysis,  Gi: Denies swallowing difficulty, stomach pain, nausea or vomiting, diarrhea, constipation, bowel incontinence Gu:  Denies bladder incontinence, burning urine Ext:   Denies Joint pain, stiffness or swelling Skin: Denies  skin rash, easy bruising or bleeding or hives Endoc:  Denies polyuria, polydipsia , polyphagia or weight change Psych:   Denies depression, insomnia or hallucinations   Other:  All other systems negative   VS: BP 117/70 (BP Location: Right Arm)    Pulse 94    Temp 97.6 F (36.4 C) (Oral)    Resp 20    Ht 5'  11" (1.803 m)    Wt 53.9 kg    SpO2 97%    BMI 16.58 kg/m      PHYSICAL EXAM    GENERAL:NAD, no fevers, chills, no weakness no fatigue HEAD: Normocephalic, atraumatic.  EYES: Pupils equal, round, reactive to light. Extraocular muscles intact. No scleral icterus.  MOUTH: Moist mucosal membrane. Dentition intact. No abscess noted.  EAR, NOSE, THROAT: Clear without exudates. No external lesions.  NECK: Supple. No thyromegaly. No nodules. No JVD.  PULMONARY: CTA on right, decreased air entry on left.  Dressing on left hemithorax, tender to light touch no grossly appreciable drainage.  CARDIOVASCULAR: S1 and S2. Regular rate and rhythm. No murmurs, rubs, or gallops. No edema. Pedal pulses 2+ bilaterally.  GASTROINTESTINAL: Soft, nontender, nondistended. No masses. Positive bowel sounds. No hepatosplenomegaly.  MUSCULOSKELETAL: No swelling, clubbing, or edema. Range of motion full in all extremities.  NEUROLOGIC: Cranial nerves II through XII are intact. No gross focal neurological deficits. Sensation intact. Reflexes intact.  SKIN: No ulceration, lesions, rashes, or cyanosis. Skin warm and dry. Turgor intact.  PSYCHIATRIC: Mood, affect within normal limits. The patient is awake, alert and oriented x 3. Insight, judgment intact.       IMAGING    Dg Chest 2 View  Result Date: 06/10/2019 CLINICAL DATA:  63 year old male for follow-up of LEFT hydropneumothorax and LEFT pleural catheter. EXAM: CHEST - 2 VIEW COMPARISON:  05/19/2019 FINDINGS: LEFT pleural catheter appears unchanged. A slightly smaller moderate to large LEFT hydropneumothorax is noted with slight decrease in air component and slight increase in fluid component. LEFT basilar atelectasis and a trace RIGHT pleural effusion again noted. COPD/emphysema changes again identified. IMPRESSION: 1. Slightly smaller moderate to large LEFT hydropneumothorax with slight decrease in air component and slight increase in fluid component. 2. LEFT  basilar atelectasis and trace RIGHT pleural effusion again noted. Electronically Signed   By: Harmon Pier M.D.   On: 06/10/2019 09:39   Dg Chest 2 View  Result Date: 05/19/2019 CLINICAL DATA:  Follow-up left chest tube and pneumothorax EXAM: CHEST - 2 VIEW COMPARISON:  05/11/2019 FINDINGS: Persistent mushroom tip left chest tube is again identified in satisfactory position. Pleural line is noted laterally consistent with a persistent hydropneumothorax. This is stable from the prior exam. Granuloma is again noted within the right lung. The right lung is otherwise within normal limits. Normality is seen. IMPRESSION: Stable left hydropneumothorax. Electronically Signed   By: Alcide Clever M.D.   On: 05/19/2019 09:44   Dg Chest Port 1 View  Result Date: 06/15/2019 CLINICAL DATA:  Empyema. EXAM: PORTABLE CHEST 1 VIEW COMPARISON:  June 14, 2019. FINDINGS: The heart size and mediastinal contours are within normal limits. No pneumothorax is noted. Stable  chronic empyema cavity is noted in the left lower chest wall with probable packing material. Stable left midlung and basilar opacity is noted. Surgical removal of the lateral portions of several left lower ribs are noted. Stable calcified granuloma seen in right lower lobe. IMPRESSION: Stable postsurgical changes are seen involving the left lateral chest wall consistent with treatment for empyema. Stable left lung opacities are noted. Electronically Signed   By: Marijo Conception M.D.   On: 06/15/2019 07:11   Dg Chest Port 1 View  Result Date: 06/14/2019 CLINICAL DATA:  Post Eloesser flap surgery for chronic left empyema. EXAM: PORTABLE CHEST 1 VIEW COMPARISON:  Chest x-ray from yesterday. FINDINGS: The heart size and mediastinal contours are within normal limits. Normal pulmonary vascularity. Interval removal of the left-sided chest tube and the left lower lateral seventh through ninth ribs. Chronic empyema cavity on the left again noted containing air and  now packing material likely accounting for the increased density overlying the left mid to lower lung. The lungs remain hyperinflated with emphysematous changes. Unchanged calcified granuloma in the right middle lobe. No acute osseous abnormality. IMPRESSION: Postsurgical changes on the left with new packing material within the chronic empyema cavity. Electronically Signed   By: Titus Dubin M.D.   On: 06/14/2019 16:22   Dg Chest Port 1 View  Result Date: 06/13/2019 CLINICAL DATA:  Postop chest wall mass excision. EXAM: PORTABLE CHEST 1 VIEW COMPARISON:  06/10/2019 FINDINGS: Lungs are adequately inflated with postoperative changes of the left lung. Chest tube over the left lung base remains in place. Less pleural fluid over the left base with small persistent pneumothorax component laterally over the left lateral thorax. Cardiomediastinal silhouette and remainder of the exam is unchanged. IMPRESSION: Left basilar chest tube remains in place with persistent small left pneumothorax and improved small amount of left pleural fluid. Electronically Signed   By: Marin Olp M.D.   On: 06/13/2019 14:47      ASSESSMENT/PLAN    Left hemithorax empyema  - s/p CT surgery evaluation - chest tube draining actively  - good tidal motion in chest tube  -culture grew pseudomonas with resitance to carbapenem- repeated cultures are same results   - cipro shows decent sensitivity but will start levoquin since its a respiratory flouroquinolone until ID provides additional guidance for antimicrobial rx   -patient still with elevated WBC count, Procal is mildly elevated   - s/p surgery - downgraded from SDU yesterday  -aggressive bronchopulmonary hygiene - patient was ale to take 1216ml tidal volume with each inhalation of incentive spirometer.  -MRSA nasal PCR+    Bullous centrolobular and parasseptal emphysema  - no signs of acute exacerbation of copd - DuoNEB nebulizer q6h - BPH as above    Right  middle lobe calcified granuloma   - likely chronic and clinically insingnificant in context of current disease    Left lower lobe atelectasis    - due to ex-vacuo physiology and compressive component from adjacent empyema   - encourage to use IS at beside 10X/hr   Tobacco abuse   - tobacco sessation counseling  - patient declined transdermal nicotine patch      Thank you for allowing me to participate in the care of this patient.   Patient/Family are satisfied with care plan and all questions have been answered.  This document was prepared using Dragon voice recognition software and may include unintentional dictation errors.     Ottie Glazier, M.D.  Division of Pulmonary &  [place]

## 2019-06-16 NOTE — Progress Notes (Signed)
  Overall he seems to be doing pretty well.  His blood pressures in the low 100s.  He has been afebrile.  His urine output has been excellent.  His appetite is been good.  I did change his dressing today.  I instructed his daughter on how to do this.  It was the first time we change the dressing.  There was a moderate amount of green discharge from the wound suggestive of Pseudomonas.  The daughter observed and asked all the appropriate questions.  The wound itself is essentially unchanged from intraoperative.  We will continue to work with our social services to provide him with the necessary supplies and medications.  In addition he will be seen today by our infectious disease experts.  He still needs significant nutritional support and physical therapy.

## 2019-06-17 ENCOUNTER — Inpatient Hospital Stay: Payer: Self-pay

## 2019-06-17 DIAGNOSIS — T8579XA Infection and inflammatory reaction due to other internal prosthetic devices, implants and grafts, initial encounter: Secondary | ICD-10-CM

## 2019-06-17 DIAGNOSIS — Z862 Personal history of diseases of the blood and blood-forming organs and certain disorders involving the immune mechanism: Secondary | ICD-10-CM

## 2019-06-17 DIAGNOSIS — Z1623 Resistance to quinolones and fluoroquinolones: Secondary | ICD-10-CM

## 2019-06-17 DIAGNOSIS — F17200 Nicotine dependence, unspecified, uncomplicated: Secondary | ICD-10-CM

## 2019-06-17 MED ORDER — PIPERACILLIN-TAZOBACTAM 3.375 G IVPB
3.3750 g | Freq: Three times a day (TID) | INTRAVENOUS | Status: DC
  Administered 2019-06-17 – 2019-06-19 (×6): 3.375 g via INTRAVENOUS
  Filled 2019-06-17 (×6): qty 50

## 2019-06-17 MED ORDER — SODIUM CHLORIDE 0.9 % IV SOLN
INTRAVENOUS | Status: DC | PRN
  Administered 2019-06-17 – 2019-06-18 (×3): 250 mL via INTRAVENOUS

## 2019-06-17 NOTE — Progress Notes (Signed)
Date of Admission:  06/10/2019      ID: Tom Mcclure is a 63 y.o. male Active Problems:   Empyema ([place])    Subjective: Patient says he is feeling better. Pain left chest better No fever No cough Appetite better Wanting to go home  Medications:  . bisacodyl  10 mg Oral Daily  . folic acid  1 mg Oral Daily  . mirtazapine  7.5 mg Oral QHS  . QUEtiapine  50 mg Oral QHS  . sertraline  50 mg Oral Daily  . sulfamethoxazole-trimethoprim  1 tablet Oral Q12H  . vitamin C  500 mg Oral Daily    Objective: Vital signs in last 24 hours: Temp:  [97.8 F (36.6 C)-98.4 F (36.9 C)] 97.8 F (36.6 C) (10/30 1127) Pulse Rate:  [90-101] 101 (10/30 1127) Resp:  [17-20] 17 (10/30 1127) BP: (111-123)/(69-73) 120/73 (10/30 1127) SpO2:  [97 %-100 %] 100 % (10/30 1127) Weight:  [58.8 kg] 58.8 kg (10/30 0356)  PHYSICAL EXAM:  General: Alert, cooperative, no distress, emaciated head: Normocephalic, without obvious abnormality, atraumatic. Eyes: Conjunctivae clear, anicteric sclerae. Pupils are equal ENT Nares normal. No drainage or sinus tenderness. Lips, mucosa, and tongue normal. No Thrush.  Poor dentition Neck: Supple, symmetrical, no adenopathy, thyroid: non tender no carotid bruit and no JVD. Back: No CVA tenderness. Lungs: Bilateral air entry.  Decreased left base Dressing over the left side of the chest. Heart: Regular rate and rhythm, no murmur, rub or gallop. Abdomen: Soft, non-tender,not distended. Bowel sounds normal. No masses Extremities: atraumatic, no cyanosis. No edema. No clubbing Skin: No rashes or lesions. Or bruising Lymph: Cervical, supraclavicular normal. Neurologic: Grossly non-focal  Lab Results Recent Labs    06/14/19 1623 06/15/19 0638  WBC 16.7* 14.0*  HGB 9.2* 7.6*  HCT 29.5* 24.5*  NA 135 136  K 4.4 4.3  CL 97* 97*  CO2 28 27  BUN 29* 27*  CREATININE 1.00 0.98   Liver Panel No results for input(s): PROT, ALBUMIN, AST, ALT, ALKPHOS,  BILITOT, BILIDIR, IBILI in the last 72 hours. Sedimentation Rate No results for input(s): ESRSEDRATE in the last 72 hours. C-Reactive Protein No results for input(s): CRP in the last 72 hours.  Microbiology: Tissue culture from the left pleura has Pseudomonas and MRSA.  The Pseudomonas is resistant to quinolones.  And cefepime Studies/Results: Chest x-ray shows stable postsurgical changes in the left lateral chest wall consistent with treatment of empyema.  Assessment/Plan:  63 year old male with history of EtOH abuse, smoker, homelessness with a 54-month history of lung infection.  Started as multifocal pneumonia due to aspiration requiring intubation in July 2020 and ended up with left empyema needing VATS and chest drain.  Prevotella in the empyema culture in July 2020.  Streptococcus bacteremia in July 2020.  After a 18-month stay in hospital in [place] was transferred to [facility] to be with his daughter.  Left-sided empyema.  With chest tube site infection.  Underwent Eloesser flap.  Pseudomonas and MRSA in the tissue culture.  He is currently on Levaquin but the Pseudomonas is resistant to quinolone and cefepime.  So delafloxacin cannot be used.  He will need IV Zosyn.  And p.o. Bactrim for MRSA.  We will treat for at least 3 weeks.  will evaluate him in my office in 2 weeks time and decide whether he needs beyond 3 weeks.    Discussed with his daughter in great detail.  Patient will be staying with his daughter and she will  be the one will be giving his IV antibiotics  Anemia of chronic disease.  Leukocytosis can [place] to empyema improving  COPD  EtOH abuse  Currently on mirtazapine, quetiapine and sertraline.  History of Streptococcus bacteremia in July 2020 History of MRSA in the sputum in July 2020 History of Prevotella empyema left side July 2020. History of Pseudomonas pneumonia September 2020. History of tracheostomy History of anemia with transfusion  ID will follow  peripherally this weekend call if needed

## 2019-06-17 NOTE — Treatment Plan (Signed)
Diagnosis: Empyema  Baseline Creatinine around 1  Culture Result: pseudomonas MRSA  Allergies  Allergen Reactions  . Morphine And Related Hives  . Penicillins Hives    Has tolerated Amoxicillin /clavulunate and Iv unasyn in Aug/septt 2020-when he was hospitalized at [facility]. So I doubt he has PCN allergy    OPAT Orders Discharge antibiotics: Zosyn 3.375grams IV every 6 hours  End Date: 07/10/19  Bactrim DS 1 BID  Oral until 07/10/19  [facility] Care Per Protocol:including placement of biopatch  Labs weekly on Monday while on IV antibiotics: _X_ CBC with differential  _X_ CMP _X_ ESR  _X_ Please pull PIC at completion of IV antibiotics  Fax weekly labs to [place] [phone]  Clinic Follow Up Appt:in 2 weeks   Call [phone] to make appt

## 2019-06-17 NOTE — Progress Notes (Signed)
Physical Therapy Treatment Patient Details Name: Tom Mcclure MRN: [id number] DOB: [birth year] Today's Date: 06/17/2019    History of Present Illness Per MD: PT is a 63 y.o. male.  He presented to our facility approximately 1 month ago after undergoing a thoracoscopy for a left-sided empyema.  When he was discharged from that facility he was told to follow-up with his primary care doctor but because he is uninsured he just came to the emergency room instead. Chest x-ray  (10/23) shows a left hydropneumothorax which is essentially unchanged from before.  Because the patient is uninsured he was unable to meet with the pulmonologist or with the pain management specialist.  He drinks 3 Ensure or boost per day, otherwise he does not eat any solid foods.  He has a chronic cough for 40 years.  MD assessment includes: L empynema S/P Eloesser flap on 10/27, Hypertension, anemia of chronic disease, Euvolemic hyponatremia, Severe protein calorie malnutrition.    PT Comments    Pt presented with deficits in strength, transfers, mobility, gait, balance, and activity tolerance. Pt was somewhat agitated from beginning of session, and when asked about pain reported that "my pain sucks" and did not quantify or specifically locate his pain. Pt's first attempt to get to EOB was very disorganized and/or he lacked the strength to complete the movement, and required mod verbal cuing to try another strategy, but ended up hooking his foot through the base of the bed to pull himself up. Pt supervised during sit<>stand transfers, and during ambulation pt leaned over the RW and used a shuffling step-through gait out the door, turned around and sat right back to bed, declining to don a mask and walk down to train on stairs despite encouragement/ reminder that he has to climb several stairs to enter his house. Nursing advised to help pt to walk to his tolerance. Pt will benefit from HHPT services upon discharge to safely address  above deficits for decreased caregiver assistance and eventual return to PLOF.     Follow Up Recommendations  Home health PT     Equipment Recommendations  3in1 (PT)    Recommendations for Other Services       Precautions / Restrictions Precautions Precautions: Fall Precaution Comments: High fall risk Restrictions Weight Bearing Restrictions: No    Mobility  Bed Mobility Overal bed mobility: Needs Assistance Bed Mobility: Supine to Sit     Supine to sit: Supervision     General bed mobility comments: Pt's first disorganized attempt to sit to EOB was unsuccessful, and required mod verbal cuing to try and succeed with another pattern.  Transfers Overall transfer level: Needs assistance   Transfers: Sit to/from Stand Sit to Stand: Supervision         General transfer comment: Pt required verbal cuing to safely complete transfers.  Ambulation/Gait Ambulation/Gait assistance: Min guard Gait Distance (Feet): 14 Feet Assistive device: Rolling walker (2 wheeled) Gait Pattern/deviations: Step-through pattern;Decreased step length - right;Decreased step length - left;Drifts right/left Gait velocity: Decreased.   General Gait Details: Pt ambulated with RW and to just outside of the room and went right back to his bed.   Stairs             Wheelchair Mobility    Modified Rankin (Stroke Patients Only)       Balance Overall balance assessment: Needs assistance Sitting-balance support: Single extremity supported;Feet unsupported Sitting balance-Leahy Scale: Fair Sitting balance - Comments: Pt able to static sit, but was unsteady.  Standing balance-Leahy Scale: Fair Standing balance comment: Mod lean into RW.                            Cognition Arousal/Alertness: Awake/alert Behavior During Therapy: Agitated Overall Cognitive Status: Within Functional Limits for tasks assessed                                 General  Comments: Pt was somewhat confrontational, swearing occasionally, initially resistant to therapy.      Exercises Total Joint Exercises Hip ABduction/ADduction: AROM;Strengthening;Both;5 reps Straight Leg Raises: AROM;Strengthening;Both;5 reps    General Comments        Pertinent Vitals/Pain Pain Score: 6  Pain Location: L chest cavity. Pt reported that "my pain sucks" and did not quantify it. Pain Descriptors / Indicators: Jabbing;Tender Pain Intervention(s): Limited activity within patient's tolerance;Monitored during session    Home Living                      Prior Function            PT Goals (current goals can now be found in the care plan section) Progress towards PT goals: Progressing toward goals    Frequency    Min 2X/week      PT Plan      Co-evaluation              AM-PAC PT "6 Clicks" Mobility   Outcome Measure  Help needed turning from your back to your side while in a flat bed without using bedrails?: None Help needed moving from lying on your back to sitting on the side of a flat bed without using bedrails?: None Help needed moving to and from a bed to a chair (including a wheelchair)?: A Little Help needed standing up from a chair using your arms (e.g., wheelchair or bedside chair)?: A Little Help needed to walk in hospital room?: A Little Help needed climbing 3-5 steps with a railing? : A Lot 6 Click Score: 19    End of Session Equipment Utilized During Treatment: Gait belt Activity Tolerance: Treatment limited secondary to agitation Patient left: in bed;with call bell/phone within reach Nurse Communication: Mobility status PT Visit Diagnosis: Unsteadiness on feet (R26.81);Muscle weakness (generalized) (M62.81);Difficulty in walking, not elsewhere classified (R26.2)     Time: 2119-4174 PT Time Calculation (min) (ACUTE ONLY): 18 min  Charges:                        Leonette Most "Gus" Keval Nam, SPT  06/17/19, 4:53 PM

## 2019-06-17 NOTE — Progress Notes (Signed)
Went to patient room to insert PICC line. Started to explain the PICC benefits, patient would like to wait for the daughter to come tomorrow am. RN made aware.

## 2019-06-17 NOTE — Progress Notes (Signed)
PHARMACY CONSULT NOTE FOR:  OUTPATIENT  PARENTERAL ANTIBIOTIC THERAPY (OPAT)  Indication: P. Aeruginosa empyema Regimen: piperacillin/tazobactam 3.375gm IV q6h over 56min infusion End date: 07/10/2019  IV antibiotic discharge orders are pended. To discharging provider:  please sign these orders via discharge navigator,  Select New Orders & click on the button choice - Manage This Unsigned Work.     Thank you for allowing pharmacy to be a part of this patient's care.  Doreene Eland, PharmD, BCPS.   Work Cell: [phone] 06/17/2019 3:58 PM

## 2019-06-17 NOTE — Progress Notes (Signed)
  Overall the patient seems to be doing pretty well.  He has refused physical therapy services.  He states that his appetite is been good while here.  I was able to observe the daughter changing his dressing and she did an excellent job with that.  I watched her remove and repacked the wound and she did a fine job with the actual procedure and with her understanding of the procedure.  The wound itself remains somewhat gray with a fair amount of discharge.  We are still working on the cultures and antibiotics.  The daughter would like to take the patient home as soon as possible preferably today.  I explained to her that we may not be able to have everything in order for that to occur.  She understands.  I appreciate all that our social worker has done and trying to organize his care.  A tremendous effort has been placed to ensure that he is able to manage himself as independently as possible.  I believe that the daughter can do the dressing changes.

## 2019-06-17 NOTE — TOC Progression Note (Signed)
Transition of Care [facility]) - Progression Note    Patient Details  Name: Tom Mcclure MRN: [id number] Date of Birth: April 17, 1956  [facility] CM/SW Contact  Beverly Sessions, RN Phone Number: 06/17/2019, 5:16 PM  Clinical Narrative:    Plan for patient to discharge home on Sunday.  Patient has been accepted by Corene Cornea with [facility] for charity home health services RN and PT  Daughter has done dressing changes at the bedside with MD  Patient has new patient appointment 11/12 at [facility].  Dr Genevive Bi agreeable to sign home health orders in the interim.    Patient to discharge on IV antibiotics.  Referral has been made to [facility] with [facility]. She has met with the patient at bedside.   Daughter has opted for IV antibiotics q6h, she will be administering in the home. Plan for discharge Sunday after morning dose of IV antibiotics with start of care services for home health on Sunday. Corene Cornea with [facility] aware.   PT has assessed patient and recommends home health PT.  Patient will need RW and [facility] for discharge.  These items have been delivered to room by Leroy Sea with [facility]   RNCM following for oral medication needs at discharge   Expected Discharge Plan: [place] Barriers to Discharge: Continued Medical Work up  Expected Discharge Plan and Services Expected Discharge Plan: [place]   Discharge Planning Services: CM Consult Post Acute Care Choice: [place] arrangements for the past 2 months: Single Family Home                           HH Arranged: RN, PT           Social Determinants of Health (SDOH) Interventions    Readmission Risk Interventions Readmission Risk Prevention Plan 06/16/2019  Post Dischage Appt Complete  Medication Screening Complete  Transportation Screening Complete

## 2019-06-18 MED ORDER — SODIUM CHLORIDE 0.9% FLUSH: 10.0000 mL | INTRAVENOUS | Status: DC | PRN

## 2019-06-18 MED ORDER — CHLORHEXIDINE GLUCONATE CLOTH 2 % EX PADS: 6.0000 | MEDICATED_PAD | Freq: Every day | CUTANEOUS | Status: DC

## 2019-06-18 MED ORDER — SODIUM CHLORIDE 0.9% FLUSH
10.0000 mL | Freq: Two times a day (BID) | INTRAVENOUS | Status: DC
  Administered 2019-06-18 – 2019-06-19 (×3): 10 mL

## 2019-06-18 NOTE — Progress Notes (Signed)
  Overall no real problems.  He has refused physical therapy and respiratory therapy.  I again changed his dressing today in the presence of his daughter.  She actually performed the procedure and did very well with it.  The wound still is somewhat gray with a greenish discharge.  The plan today is to have a PICC line placed.  Discussions have been made about his home IV antibiotics.  Once this is all arranged I believe that his daughter is capable of performing his dressing changes at home.  I stressed to the daughter and to the patient the need for him to increase his nutrition as well as to participate in physical therapy and respiratory therapy.

## 2019-06-18 NOTE — Progress Notes (Signed)
Peripherally Inserted Central Catheter/Midline Placement  The IV Nurse has discussed with the patient and/or persons authorized to consent for the patient, the purpose of this procedure and the potential benefits and risks involved with this procedure.  The benefits include less needle sticks, lab draws from the catheter, and the patient may be discharged home with the catheter. Risks include, but not limited to, infection, bleeding, blood clot (thrombus formation), and puncture of an artery; nerve damage and irregular heartbeat and possibility to perform a PICC exchange if needed/ordered by physician.  Alternatives to this procedure were also discussed.  Bard Power PICC patient education guide, fact sheet on infection prevention and patient information card has been provided to patient /or left at bedside.    PICC/Midline Placement Documentation  PICC Single Lumen 25/85/27 PICC Right Basilic 40 cm 0 cm (Active)  Indication for Insertion or Continuance of Line Home intravenous therapies (PICC only) 06/18/19 1241  Exposed Catheter (cm) 0 cm 06/18/19 1241  Site Assessment Clean;Dry;Intact 06/18/19 1241  Line Status Blood return noted;Flushed;Saline locked 06/18/19 1241  Dressing Type Transparent;Securing device 06/18/19 1241  Dressing Status Clean;Dry;Intact;Antimicrobial disc in place 06/18/19 1241  Dressing Change Due 06/25/19 06/18/19 1241       [name] 06/18/2019, 12:45 PM

## 2019-06-18 NOTE — Progress Notes (Signed)
Spoke with Cloyde Reams RN who states the pt and daughter are both agreeable to PICC placement.

## 2019-06-19 LAB — AEROBIC/ANAEROBIC CULTURE W GRAM STAIN (SURGICAL/DEEP WOUND)

## 2019-06-19 MED ORDER — HYDROCODONE-ACETAMINOPHEN 5-325 MG PO TABS: 1.0000 | ORAL_TABLET | ORAL | 0 refills | Status: AC | PRN

## 2019-06-19 MED ORDER — SULFAMETHOXAZOLE-TRIMETHOPRIM 800-160 MG PO TABS: 1.0000 | ORAL_TABLET | Freq: Two times a day (BID) | ORAL | 0 refills | Status: DC

## 2019-06-19 MED ORDER — PIPERACILLIN-TAZOBACTAM IV (FOR PTA / DISCHARGE USE ONLY): 3.3750 g | Freq: Four times a day (QID) | INTRAVENOUS | 0 refills | Status: AC

## 2019-06-19 NOTE — Progress Notes (Signed)
MD ordered patient to be discharged home.  Discharge instructions were reviewed with the patient and daughter and they voiced understanding.  Patient instructed on making follow-up appointment.  Prescriptions given to the patient.  IV was removed with catheter intact.  All patients questions were answered.  Patient left via wheelchair escorted by myself.

## 2019-06-19 NOTE — Progress Notes (Signed)
Pulmonary Medicine          Date: 06/19/2019,   MRN# [id number] Tom Mcclure [birth year]     AdmissionWeight: 45.4 kg                 CurrentWeight: 51.3 kg      CHIEF COMPLAINT:   Empyema   SUBJECTIVE    Patient is sitting up in bed states "I feel great ".  Status post eval by cardiothoracic surgery at this morning optimized for discharge.  Appreciate ID collaboration to set up antibiotic regimen.   PAST MEDICAL HISTORY   Past Medical History:  Diagnosis Date  . COPD (chronic obstructive pulmonary disease) ([place])   . Depression   . GERD (gastroesophageal reflux disease)   . Hypertension      SURGICAL HISTORY   Past Surgical History:  Procedure Laterality Date  . CHEST TUBE INSERTION     left chest  . ELOESSOR Left 06/14/2019   Procedure: ELOESSOR PROCEDURE;  Surgeon: Nestor Lewandowsky, MD;  Location: [facility] ORS;  Service: Thoracic;  Laterality: Left;  . TRACHEOSTOMY       FAMILY HISTORY   Family History  Problem Relation Age of Onset  . Leukemia Mother   . Alcohol abuse Father      SOCIAL HISTORY   Social History   Tobacco Use  . Smoking status: Current Every Day Smoker    Packs/day: 0.25    Types: Cigarettes  . Smokeless tobacco: Never Used  Substance Use Topics  . Alcohol use: Yes    Comment: "too much"  . Drug use: Never     MEDICATIONS    Home Medication:    Current Medication:  Current Facility-Administered Medications:  .  0.9 %  sodium chloride infusion, , Intravenous, PRN, Nestor Lewandowsky, MD, Last Rate: 10 mL/hr at 06/18/19 0508, 250 mL at 06/18/19 0508 .  bisacodyl (DULCOLAX) EC tablet 10 mg, 10 mg, Oral, Daily, Nestor Lewandowsky, MD, 10 mg at 06/19/19 1100 .  Chlorhexidine Gluconate Cloth 2 % PADS 6 each, 6 each, Topical, Daily, Oaks, [place], MD .  fentaNYL (SUBLIMAZE) injection 25-50 mcg, 25-50 mcg, Intravenous, Q2H PRN, Nestor Lewandowsky, MD, 50 mcg at 06/19/19 0754 .  folic acid (FOLVITE) tablet 1 mg, 1 mg, Oral,  Daily, Oaks, [place], MD, 1 mg at 06/19/19 1100 .  HYDROcodone-acetaminophen (NORCO/VICODIN) 5-325 MG per tablet 1-2 tablet, 1-2 tablet, Oral, Q4H PRN, Nestor Lewandowsky, MD, 2 tablet at 06/19/19 0443 .  levalbuterol (XOPENEX) nebulizer solution 0.63 mg, 0.63 mg, Nebulization, Q4H PRN, Nestor Lewandowsky, MD .  mirtazapine (REMERON) tablet 7.5 mg, 7.5 mg, Oral, QHS, Oaks, Christia [place], MD, 7.5 mg at 06/18/19 2125 .  piperacillin-tazobactam (ZOSYN) IVPB 3.375 g, 3.375 g, Intravenous, Q8H, Ravishankar, Jayashree, MD, Last Rate: 12.5 mL/hr at 06/19/19 0448, 3.375 g at 06/19/19 0448 .  QUEtiapine (SEROQUEL) tablet 50 mg, 50 mg, Oral, QHS, Oaks, Christia [place], MD, 50 mg at 06/18/19 2125 .  sertraline (ZOLOFT) tablet 50 mg, 50 mg, Oral, Daily, Nestor Lewandowsky, MD, 50 mg at 06/19/19 1100 .  sodium chloride flush (NS) 0.9 % injection 10-40 mL, 10-40 mL, Intracatheter, Q12H, Nestor Lewandowsky, MD, 10 mL at 06/19/19 1102 .  sodium chloride flush (NS) 0.9 % injection 10-40 mL, 10-40 mL, Intracatheter, PRN, Nestor Lewandowsky, MD .  sulfamethoxazole-trimethoprim (BACTRIM DS) 800-160 MG per tablet 1 tablet, 1 tablet, Oral, Q12H, Tsosie Billing, MD, 1 tablet at 06/19/19 1100 .  vitamin C (ASCORBIC ACID) tablet 500 mg, 500 mg, Oral, Daily,  Hulda Marin, MD, 500 mg at 06/19/19 1100    ALLERGIES   Morphine and related and Penicillins     REVIEW OF SYSTEMS    Review of Systems:  Gen:  Denies  fever, sweats, chills weigh loss  HEENT: Denies blurred vision, double vision, ear pain, eye pain, hearing loss, nose bleeds, sore throat Cardiac:  No dizziness, chest pain or heaviness, chest tightness,edema Resp:   Denies cough or sputum porduction, shortness of breath,wheezing, hemoptysis,  Gi: Denies swallowing difficulty, stomach pain, nausea or vomiting, diarrhea, constipation, bowel incontinence Gu:  Denies bladder incontinence, burning urine Ext:   Denies Joint pain, stiffness or swelling Skin: Denies  skin rash, easy bruising  or bleeding or hives Endoc:  Denies polyuria, polydipsia , polyphagia or weight change Psych:   Denies depression, insomnia or hallucinations   Other:  All other systems negative   VS: BP 101/65 (BP Location: Right Arm)   Pulse 94   Temp 98.3 F (36.8 C) (Oral)   Resp 18   Ht 5\' 11"  (1.803 m)   Wt 51.3 kg   SpO2 100%   BMI 15.77 kg/m      PHYSICAL EXAM    GENERAL:NAD, no fevers, chills, no weakness no fatigue HEAD: Normocephalic, atraumatic.  EYES: Pupils equal, round, reactive to light. Extraocular muscles intact. No scleral icterus.  MOUTH: Moist mucosal membrane. Dentition intact. No abscess noted.  EAR, NOSE, THROAT: Clear without exudates. No external lesions.  NECK: Supple. No thyromegaly. No nodules. No JVD.  PULMONARY: CTA on right, decreased air entry on left.  Dressing on left hemithorax, tender to light touch no grossly appreciable drainage.  CARDIOVASCULAR: S1 and S2. Regular rate and rhythm. No murmurs, rubs, or gallops. No edema. Pedal pulses 2+ bilaterally.  GASTROINTESTINAL: Soft, nontender, nondistended. No masses. Positive bowel sounds. No hepatosplenomegaly.  MUSCULOSKELETAL: No swelling, clubbing, or edema. Range of motion full in all extremities.  NEUROLOGIC: Cranial nerves II through XII are intact. No gross focal neurological deficits. Sensation intact. Reflexes intact.  SKIN: No ulceration, lesions, rashes, or cyanosis. Skin warm and dry. Turgor intact.  PSYCHIATRIC: Mood, affect within normal limits. The patient is awake, alert and oriented x 3. Insight, judgment intact.       IMAGING    Dg Chest 2 View  Result Date: 06/10/2019 CLINICAL DATA:  63 year old male for follow-up of LEFT hydropneumothorax and LEFT pleural catheter. EXAM: CHEST - 2 VIEW COMPARISON:  05/19/2019 FINDINGS: LEFT pleural catheter appears unchanged. A slightly smaller moderate to large LEFT hydropneumothorax is noted with slight decrease in air component and slight increase  in fluid component. LEFT basilar atelectasis and a trace RIGHT pleural effusion again noted. COPD/emphysema changes again identified. IMPRESSION: 1. Slightly smaller moderate to large LEFT hydropneumothorax with slight decrease in air component and slight increase in fluid component. 2. LEFT basilar atelectasis and trace RIGHT pleural effusion again noted. Electronically Signed   By: 07/19/2019 M.D.   On: 06/10/2019 09:39   Dg Chest Port 1 View  Result Date: 06/15/2019 CLINICAL DATA:  Empyema. EXAM: PORTABLE CHEST 1 VIEW COMPARISON:  June 14, 2019. FINDINGS: The heart size and mediastinal contours are within normal limits. No pneumothorax is noted. Stable chronic empyema cavity is noted in the left lower chest wall with probable packing material. Stable left midlung and basilar opacity is noted. Surgical removal of the lateral portions of several left lower ribs are noted. Stable calcified granuloma seen in right lower lobe. IMPRESSION: Stable postsurgical changes are seen  involving the left lateral chest wall consistent with treatment for empyema. Stable left lung opacities are noted. Electronically Signed   By: Lupita RaiderJames  Green Jr M.D.   On: 06/15/2019 07:11   Dg Chest Port 1 View  Result Date: 06/14/2019 CLINICAL DATA:  Post Eloesser flap surgery for chronic left empyema. EXAM: PORTABLE CHEST 1 VIEW COMPARISON:  Chest x-ray from yesterday. FINDINGS: The heart size and mediastinal contours are within normal limits. Normal pulmonary vascularity. Interval removal of the left-sided chest tube and the left lower lateral seventh through ninth ribs. Chronic empyema cavity on the left again noted containing air and now packing material likely accounting for the increased density overlying the left mid to lower lung. The lungs remain hyperinflated with emphysematous changes. Unchanged calcified granuloma in the right middle lobe. No acute osseous abnormality. IMPRESSION: Postsurgical changes on the left with new  packing material within the chronic empyema cavity. Electronically Signed   By: Obie DredgeWilliam T Derry M.D.   On: 06/14/2019 16:22   Dg Chest Port 1 View  Result Date: 06/13/2019 CLINICAL DATA:  Postop chest wall mass excision. EXAM: PORTABLE CHEST 1 VIEW COMPARISON:  06/10/2019 FINDINGS: Lungs are adequately inflated with postoperative changes of the left lung. Chest tube over the left lung base remains in place. Less pleural fluid over the left base with small persistent pneumothorax component laterally over the left lateral thorax. Cardiomediastinal silhouette and remainder of the exam is unchanged. IMPRESSION: Left basilar chest tube remains in place with persistent small left pneumothorax and improved small amount of left pleural fluid. Electronically Signed   By: Elberta Fortisaniel  Boyle M.D.   On: 06/13/2019 14:47   [place] Ekg Site Rite  Result Date: 06/17/2019 If Site Rite image not attached, placement could not be confirmed due to current cardiac rhythm.     ASSESSMENT/PLAN    Left hemithorax empyema  - s/p CT surgery evaluation - chest tube draining actively  - good tidal motion in chest tube  -culture grew pseudomonas with resitance to carbapenem- repeated cultures are same results   -Antimicrobials as per ID-appreciate collaboration  -WBC count trending down  -aggressive bronchopulmonary hygiene - patient was ale to take 1250ml tidal volume with each inhalation of incentive spirometer.  -MRSA nasal PCR+    Bullous centrolobular and parasseptal emphysema  - no signs of acute exacerbation of copd - DuoNEB nebulizer q6h - BPH as above    Right middle lobe calcified granuloma   - likely chronic and clinically insingnificant in context of current disease    Left lower lobe atelectasis    - due to ex-vacuo physiology and compressive component from adjacent empyema   - encourage to use IS at beside 10X/hr   Tobacco abuse   - tobacco sessation counseling  - patient declined transdermal  nicotine patch      Thank you for allowing me to participate in the care of this patient.   Patient/Family are satisfied with care plan and all questions have been answered.  This document was prepared using Dragon voice recognition software and may include unintentional dictation errors.     Vida RiggerFuad Jayron Maqueda, M.D.  Division of Pulmonary & Critical Care Medicine  [facility]

## 2019-06-19 NOTE — TOC Transition Note (Addendum)
Transition of Care [facility]) - CM/SW Discharge Note   Patient Details  Name: Tom Mcclure MRN: [id number] Date of Birth: 06-02-1956  [facility] Care [facility]) CM/SW Contact:  Latanya Maudlin, RN Phone Number: 06/19/2019, 10:00 AM   Clinical Narrative:  Patient to be discharged per MD order. Orders in place for home health services. Referral for services was previously given to [facility]. Caryl Pina at [facility] aware of discharge. Pam with [facility] has been following for IV abx administration. Patient is scheduled to receive dose today and start of care around 2pm. Bedside commode already delivered to room. Family to transport.Goodrx coupon provided for Bactrim     Final next level of care: Home w Home Health Services Barriers to Discharge: No Barriers Identified   Patient Goals and CMS Choice     Choice offered to / list presented to : Patient  Discharge Placement                       Discharge Plan and Services   Discharge Planning Services: CM Consult Post Acute Care Choice: Home Health          DME Arranged: IV pump/equipment         HH Arranged: RN, PT [place] Agency: [place] ([facility]) Date HH Agency Contacted: 06/19/19 Time HH Agency Contacted: 1000 Representative spoke with at [place]: Thornton Dales  Social Determinants of Health ([place]) Interventions     Readmission Risk Interventions Readmission Risk Prevention Plan 06/16/2019  Post Dischage Appt Complete  Medication Screening Complete  Transportation Screening Complete

## 2019-06-19 NOTE — Progress Notes (Signed)
  No complaints.  VSS, Afeb  Right lung clear and diminished breath sounds on left Heart regular Abd soft  Dressing over Eloesser is dry  PICC inserted yesterday  Ready for discharge  [employer].

## 2019-06-20 NOTE — Discharge Summary (Signed)
Physician Discharge Summary  Patient ID: Tom Mcclure MRN: [id number] DOB/AGE: 1956/08/11 63 y.o.  Admit date: 06/10/2019 Discharge date: 06/20/2019   Discharge Diagnoses:  Active Problems:   Empyema ([place])   Procedures:  Open drainage of left empyema cavity (Eloesser flap)  Hospital Course: After admission to the hospital her was prepared for surgery with consultations by our pulmonologist and infectious disease experts.  He underwent surgery and recovered in the ICU overnight.  No mechanical ventilation needed.  He was nursed on the floor and his daughter was instructed on dressing changes.  He was seen by our social service department and they have arranged for him to have a primary care physician and to get all his supplies.  The final cultures from the OR required IV Zosyn and oral Bactrim.  He was discharged to home with all the appropriate supplies and assistance.  He will be seen in our office in 5 days and he will follow-up with the ID folks as well.    Disposition:   Discharge Instructions    Call MD for:  difficulty breathing, headache or visual disturbances   Complete by: As directed    Call MD for:  extreme fatigue   Complete by: As directed    Call MD for:  hives   Complete by: As directed    Call MD for:  persistant dizziness or light-headedness   Complete by: As directed    Call MD for:  persistant nausea and vomiting   Complete by: As directed    Call MD for:  redness, tenderness, or signs of infection (pain, swelling, redness, odor or green/yellow discharge around incision site)   Complete by: As directed    Call MD for:  severe uncontrolled pain   Complete by: As directed    Call MD for:  temperature >100.4   Complete by: As directed    Diet - low sodium heart healthy   Complete by: As directed    Home infusion instructions [facility] May follow [place] Dosing Protocol; May administer Cathflo as needed to maintain patency of vascular access  device.; Flushing of vascular access device: per [facility] Protocol: 0.9% NaCl pre/post medica...   Complete by: As directed    Instructions: May follow [place] Dosing Protocol   Instructions: May administer Cathflo as needed to maintain patency of vascular access device.   Instructions: Flushing of vascular access device: per [facility] Protocol: 0.9% NaCl pre/post medication administration and prn patency; Heparin 100 u/ml, 78m for implanted ports and Heparin 10u/ml, 555mfor all other central venous catheters.   Instructions: May follow [facility] Anaphylaxis Protocol for First Dose Administration in the home: 0.9% NaCl at 25-50 ml/hr to maintain IV access for protocol meds. Epinephrine 0.3 ml IV/IM PRN and Benadryl 25-50 IV/IM PRN s/s of anaphylaxis.   Instructions: [facility] Coordinator (RN) to assist per patient IV care needs in the home PRN.   Increase activity slowly   Complete by: As directed      Allergies as of 06/19/2019      Reactions   Morphine And Related Hives   Penicillins Hives   Has tolerated Amoxicillin /clavulunate and Iv unasyn in Aug/septt 2020-[facility]So I doubt he has PCN allergy      Medication List    STOP taking these medications   metoprolol tartrate 25 MG tablet Commonly known as: LOPRESSOR   vitamin C 500 MG tablet Commonly known as: ASCORBIC  ACID     TAKE these medications   folic acid 1 MG tablet Commonly known as: FOLVITE Take 1 mg by mouth daily.   HYDROcodone-acetaminophen 5-325 MG tablet Commonly known as: NORCO/VICODIN Take 1 tablet by mouth every 4 (four) hours as needed for moderate pain.   ipratropium 17 MCG/ACT inhaler Commonly known as: ATROVENT HFA Inhale 1 puff into the lungs 2 (two) times daily.   levalbuterol 45 MCG/ACT inhaler Commonly known as: XOPENEX HFA Inhale 1-2 puffs into the lungs every 4 (four) hours as needed for wheezing.   mirtazapine 15 MG tablet Commonly known as: REMERON Take  0.5 tablets (7.5 mg total) by mouth at bedtime.   piperacillin-tazobactam  IVPB Commonly known as: ZOSYN Inject 3.375 g into the vein every 6 (six) hours for 21 days. Indication:  P. Aeruginosa empyema  Last Day of Therapy:  07/10/2019 Labs - every monday:  CBC/D, CMP, ESR   QUEtiapine 50 MG tablet Commonly known as: SEROQUEL Take 1 tablet (50 mg total) by mouth at bedtime.   sertraline 50 MG tablet Commonly known as: ZOLOFT Take 1 tablet (50 mg total) by mouth daily.   sulfamethoxazole-trimethoprim 800-160 MG tablet Commonly known as: BACTRIM DS Take 1 tablet by mouth every 12 (twelve) hours.            Home Infusion Instuctions  (From admission, onward)         Start     Ordered   06/19/19 0000  Home infusion instructions [facility] May follow [place] Dosing Protocol; May administer Cathflo as needed to maintain patency of vascular access device.; Flushing of vascular access device: per [facility] Protocol: 0.9% NaCl pre/post medica...    Question Answer Comment  Instructions May follow [place] Dosing Protocol   Instructions May administer Cathflo as needed to maintain patency of vascular access device.   Instructions Flushing of vascular access device: per [facility] Protocol: 0.9% NaCl pre/post medication administration and prn patency; Heparin 100 u/ml, 74m for implanted ports and Heparin 10u/ml, 557mfor all other central venous catheters.   Instructions May follow [facility] Anaphylaxis Protocol for First Dose Administration in the home: 0.9% NaCl at 25-50 ml/hr to maintain IV access for protocol meds. Epinephrine 0.3 ml IV/IM PRN and Benadryl 25-50 IV/IM PRN s/s of anaphylaxis.   Instructions [facility] Infusion Coordinator (RN) to assist per patient IV care needs in the home PRN.      06/19/19 09[place] on 06/30/2019.   Why: This visit will be TeleVideo at 9:00am. Contact information: [id number] [phone]          TiNestor LewandowskyMD

## 2019-06-23 ENCOUNTER — Other Ambulatory Visit: Payer: Self-pay

## 2019-06-23 ENCOUNTER — Encounter: Payer: Self-pay | Admitting: Physician Assistant

## 2019-06-23 ENCOUNTER — Ambulatory Visit (INDEPENDENT_AMBULATORY_CARE_PROVIDER_SITE_OTHER): Payer: Self-pay | Admitting: Physician Assistant

## 2019-06-23 VITALS — BP 123/80 | HR 96 | Temp 97.5°F | Ht 71.0 in | Wt 106.2 lb

## 2019-06-23 DIAGNOSIS — J869 Pyothorax without fistula: Secondary | ICD-10-CM

## 2019-06-23 DIAGNOSIS — Z09 Encounter for follow-up examination after completed treatment for conditions other than malignant neoplasm: Secondary | ICD-10-CM

## 2019-06-23 NOTE — Progress Notes (Signed)
[facility] POST-OP OFFICE VISIT  06/23/2019  HPI: Tom Mcclure is a 63 y.o. male 9 days s/p Eloesser flap procedure for chronic left chest empyema with Dr Genevive Bi.   He was discharged from the hospital on 11/01 with PO bactrim and IV Zosyn q6. He presents with his daughter. He is in much better spirits. He reports that he is doing well. He has gained 6lbs since discharged from the hospital. His appetite has improved and continues supplementing with Ensure. His daughter has been preforming dressing changes. Working with home PT.   Vital signs: BP 123/80   Pulse 96   Temp (!) 97.5 F (36.4 C) (Temporal)   Ht 5\' 11"  (1.803 m)   Wt 106 lb 3.2 oz (48.2 kg)   SpO2 98%   BMI 14.81 kg/m    Physical Exam: Constitutional: Well appearing, thin, male, NAD Chest: Eloesser flap to the left lateral chest wall, surrounding skin intact, wound bed intact, minimal purulent drainage on dressing, audible air leak  Assessment/Plan: This is a 63 y.o. male 9 days s/p Eloesser flap procedure for chronic left chest empyema   - Continue IV and PO Abx as prescribed  - Continue daily dressing changes + prn  - Okay to add imodium for diarrhea  - follow up next week with Dr Genevive Bi for reassessment  -- Edison Simon, PA-C [facility] 06/23/2019, 2:36 PM [phone] M-F: 7am - 4pm

## 2019-06-23 NOTE — Patient Instructions (Signed)
Patient is scheduled for follow up appointment with Dr.Oaks on 07/01/19 at 10:45am.

## 2019-06-27 ENCOUNTER — Telehealth: Payer: Self-pay | Admitting: *Deleted

## 2019-06-27 NOTE — Telephone Encounter (Signed)
Attempted to call back patient but no answer and the voicemail is full. Folic Acid 1mg  is available over the counter.

## 2019-06-27 NOTE — Telephone Encounter (Signed)
Patient called and stated that he was given folic acid in the hospital and he in running low and wanted to know if he can get a prescription for it. At [facility] in [place]

## 2019-06-28 NOTE — Telephone Encounter (Signed)
Patient notified and will pick this up.

## 2019-06-30 ENCOUNTER — Telehealth: Payer: Self-pay | Admitting: Infectious Diseases

## 2019-06-30 NOTE — Telephone Encounter (Signed)
Patient on IV  Dr requesting to see him next week sometime  Left message for him to call back to make appt   Thanks

## 2019-07-05 ENCOUNTER — Telehealth: Payer: Self-pay | Admitting: Infectious Diseases

## 2019-07-05 NOTE — Telephone Encounter (Signed)
Called to make an appt either today (11/17) or Thursday (11/19) per Dr. Tia Masker

## 2019-07-07 ENCOUNTER — Other Ambulatory Visit: Payer: Self-pay

## 2019-07-07 ENCOUNTER — Telehealth: Payer: Self-pay

## 2019-07-07 ENCOUNTER — Ambulatory Visit
Admission: RE | Admit: 2019-07-07 | Discharge: 2019-07-07 | Disposition: A | Discharge status: Home / Self Care | Payer: Medicaid Other | Source: Ambulatory Visit | Attending: Cardiothoracic Surgery | Admitting: Cardiothoracic Surgery

## 2019-07-07 DIAGNOSIS — J869 Pyothorax without fistula: Secondary | ICD-10-CM

## 2019-07-07 NOTE — Telephone Encounter (Signed)
Patient notified of chest xray prior to appointment at 7:45 07/08/19.

## 2019-07-07 NOTE — Telephone Encounter (Signed)
Tried reaching patient, however mailbox is full, unable to leave message.   Appointment made 07/08/2019 @ 8 am , chest  xray today.

## 2019-07-08 ENCOUNTER — Telehealth: Payer: Self-pay | Admitting: *Deleted

## 2019-07-08 ENCOUNTER — Other Ambulatory Visit: Payer: Self-pay

## 2019-07-08 ENCOUNTER — Ambulatory Visit (INDEPENDENT_AMBULATORY_CARE_PROVIDER_SITE_OTHER): Payer: Self-pay | Admitting: Cardiothoracic Surgery

## 2019-07-08 ENCOUNTER — Encounter: Payer: Self-pay | Admitting: Cardiothoracic Surgery

## 2019-07-08 VITALS — BP 121/77 | HR 96 | Temp 98.1°F | Resp 18 | Ht 71.0 in | Wt 110.0 lb

## 2019-07-08 DIAGNOSIS — J869 Pyothorax without fistula: Secondary | ICD-10-CM

## 2019-07-08 NOTE — Progress Notes (Signed)
  He comes in today in follow-up after his left chest wall resection for an Eloesser flap.  He and his daughter both state that he has been getting along quite nicely overall.  He has gained approximately 10 pounds according to their scales at home.  His appetite is improving although still not back to what she thinks should be normal for him.  He does smoke.  I change his dressings today.  There is a small to moderate amount of green-yellowish drainage.  The wound has some areas of actual granulation tissue.  The flap itself is now loose and under no tension and therefore I removed the sutures.  Overall I believe that his daughter is doing an excellent job with wound care management.  He is on his antibiotics and will continue those.  He needs a primary care physician and we have asked him to follow-up at the [facility] clinic.  I will see him back again in 2 weeks.

## 2019-07-08 NOTE — Telephone Encounter (Signed)
[facility] with a primary care physician. I talked with them and they are going to call him and get him started on an application and for an appointment.

## 2019-07-08 NOTE — Patient Instructions (Signed)
[facility]. Someone from their office will call to schedule an appointment.   Please see your follow up appointment listed below.

## 2019-07-12 ENCOUNTER — Telehealth: Payer: Self-pay | Admitting: Infectious Diseases

## 2019-07-12 NOTE — Telephone Encounter (Signed)
Nurse called asking if continuing IV antibiotics or not.  Currently out of medicine.  [place] with [facility] [phone]  Left message on voicemail - 4:40p Yesterday 07/11/19

## 2019-07-12 NOTE — Telephone Encounter (Signed)
Need to remove PICC line

## 2019-07-13 LAB — FUNGUS CULTURE WITH STAIN

## 2019-07-13 LAB — FUNGUS CULTURE RESULT

## 2019-07-13 LAB — FUNGAL ORGANISM REFLEX

## 2019-07-13 NOTE — Telephone Encounter (Signed)
Advised Tom Mcclure at [facility] and pt will schedule f/u

## 2019-07-22 ENCOUNTER — Encounter: Payer: Self-pay | Admitting: Cardiothoracic Surgery

## 2019-07-22 ENCOUNTER — Other Ambulatory Visit: Payer: Self-pay

## 2019-07-22 ENCOUNTER — Ambulatory Visit (INDEPENDENT_AMBULATORY_CARE_PROVIDER_SITE_OTHER): Payer: Self-pay | Admitting: Cardiothoracic Surgery

## 2019-07-22 VITALS — BP 137/90 | HR 109 | Temp 98.1°F | Resp 14 | Ht 71.0 in | Wt 102.4 lb

## 2019-07-22 DIAGNOSIS — J869 Pyothorax without fistula: Secondary | ICD-10-CM

## 2019-07-22 NOTE — Progress Notes (Signed)
  Patient ID: Tom Mcclure, male   DOB: 05-Feb-1956, 63 y.o.   MRN: [id number]  HISTORY: He returns today in follow-up.  His daughter states that there is been some greenish discharge on the dressings.  The patient has been unwilling to be out of bed and spends almost all of his time lying down.  He does not eat much and does not exercise at all.   Vitals:   07/22/19 0914  BP: 137/90  Pulse: (!) 109  Resp: 14  Temp: 98.1 F (36.7 C)  SpO2: 97%     EXAM:    His wound was redressed today.  Overall looks pale with a grayish-green discharge.  I see minimal granulation tissue   ASSESSMENT: Status post Eloesser flap for failed decortication of the lung   PLAN:   We will change the dressing to once per day half-strength Dakin solution.  Perhaps this may improve the odor from the wound and the Pseudomonas present.  I will see the patient back again in 4 weeks.  They are still unable to get a primary care physician.  He will see the infectious disease experts in 2 more weeks.  The daughter however states that he has refused his intravenous antibiotics.    Nestor Lewandowsky, MD

## 2019-07-22 NOTE — Patient Instructions (Addendum)
Please begin the baking solution and change the dressing daily. Please see your follow up appointment listed below.  No chest xray needed for this visit.

## 2019-07-28 LAB — ACID FAST CULTURE WITH REFLEXED SENSITIVITIES (MYCOBACTERIA): Acid Fast Culture: NEGATIVE

## 2019-08-26 ENCOUNTER — Encounter: Payer: Self-pay | Admitting: Cardiothoracic Surgery

## 2019-08-26 ENCOUNTER — Other Ambulatory Visit: Payer: Self-pay

## 2019-08-26 ENCOUNTER — Ambulatory Visit (INDEPENDENT_AMBULATORY_CARE_PROVIDER_SITE_OTHER): Payer: Self-pay | Admitting: Cardiothoracic Surgery

## 2019-08-26 VITALS — BP 127/84 | HR 94 | Temp 97.5°F | Ht 71.0 in | Wt 115.0 lb

## 2019-08-26 DIAGNOSIS — J869 Pyothorax without fistula: Secondary | ICD-10-CM

## 2019-08-26 NOTE — Progress Notes (Signed)
  He returns today in follow-up.  His daughter has been changing his wounds daily with Dakin's soaked Kerlix.  She states that she has been using less over time.  The wound itself is still somewhat gray but less so and has no foul-smelling discharge.  There is little granulation tissue but I see no signs of infection at the base of the wound.  Obviously the lung is still exposed and there are several small holes measuring about a millimeter in size where in the past or been air leaking from it.  We did not place water in the wound today as it makes him cough.  We will continue the Dakin's solution.  We will pack the wound once per day.  I will see him back again in 1 month.  We will get a chest x-ray at that time.  The daughter was congratulated on taking such great care of the wound.  All of their questions were answered.

## 2019-08-26 NOTE — Patient Instructions (Addendum)
Dr.Oaks recommends patient to continue using the Dakins solution and Kerlix once a day to clean the area.

## 2019-09-23 ENCOUNTER — Encounter: Payer: Self-pay | Admitting: Cardiothoracic Surgery
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
